# Patient Record
Sex: Male | Born: 1970 | Race: White | Hispanic: No | Smoking: Current every day smoker
Health system: Southern US, Community
[De-identification: ages and names within clinical notes are randomized; demographics above are authoritative.]

## PROBLEM LIST (undated history)

## (undated) DIAGNOSIS — E162 Hypoglycemia, unspecified: Secondary | ICD-10-CM

## (undated) DIAGNOSIS — M5126 Other intervertebral disc displacement, lumbar region: Secondary | ICD-10-CM

## (undated) DIAGNOSIS — M1990 Unspecified osteoarthritis, unspecified site: Secondary | ICD-10-CM

## (undated) DIAGNOSIS — M51369 Other intervertebral disc degeneration, lumbar region without mention of lumbar back pain or lower extremity pain: Secondary | ICD-10-CM

## (undated) DIAGNOSIS — F419 Anxiety disorder, unspecified: Secondary | ICD-10-CM

## (undated) DIAGNOSIS — K219 Gastro-esophageal reflux disease without esophagitis: Secondary | ICD-10-CM

## (undated) HISTORY — PX: CLAVICLE SURGERY: SHX598

## (undated) HISTORY — DX: Anxiety disorder, unspecified: F41.9

## (undated) HISTORY — PX: HAND SURGERY: SHX662

## (undated) HISTORY — DX: Other intervertebral disc degeneration, lumbar region without mention of lumbar back pain or lower extremity pain: M51.369

## (undated) HISTORY — DX: Other intervertebral disc displacement, lumbar region: M51.26

## (undated) HISTORY — DX: Unspecified osteoarthritis, unspecified site: M19.90

## (undated) HISTORY — DX: Gastro-esophageal reflux disease without esophagitis: K21.9

## (undated) HISTORY — DX: Hypoglycemia, unspecified: E16.2

---

## 2001-03-11 ENCOUNTER — Emergency Department (HOSPITAL_COMMUNITY): Admission: EM | Admit: 2001-03-11 | Discharge: 2001-03-12 | Payer: Self-pay | Admitting: *Deleted

## 2001-03-12 ENCOUNTER — Encounter: Payer: Self-pay | Admitting: *Deleted

## 2001-03-17 ENCOUNTER — Emergency Department (HOSPITAL_COMMUNITY): Admission: EM | Admit: 2001-03-17 | Discharge: 2001-03-17 | Payer: Self-pay | Admitting: *Deleted

## 2004-03-03 ENCOUNTER — Ambulatory Visit (HOSPITAL_COMMUNITY): Admission: RE | Admit: 2004-03-03 | Discharge: 2004-03-03 | Payer: Self-pay | Admitting: Family Medicine

## 2006-06-24 IMAGING — CR DG CLAVICLE*R*
3 series · 3 of 3 positions shown · non-contrast
Comparison: 03/12/01
 Resection of the distal right clavicle is again noted.
COMPARISON: 03/12/01
 Resection of the distal right clavicle is again noted.

CLINICAL DATA: Right shoulder pain injury. 
 THREE VIEW RIGHT CLAVICLE:

[view not recorded (1 of 3)]
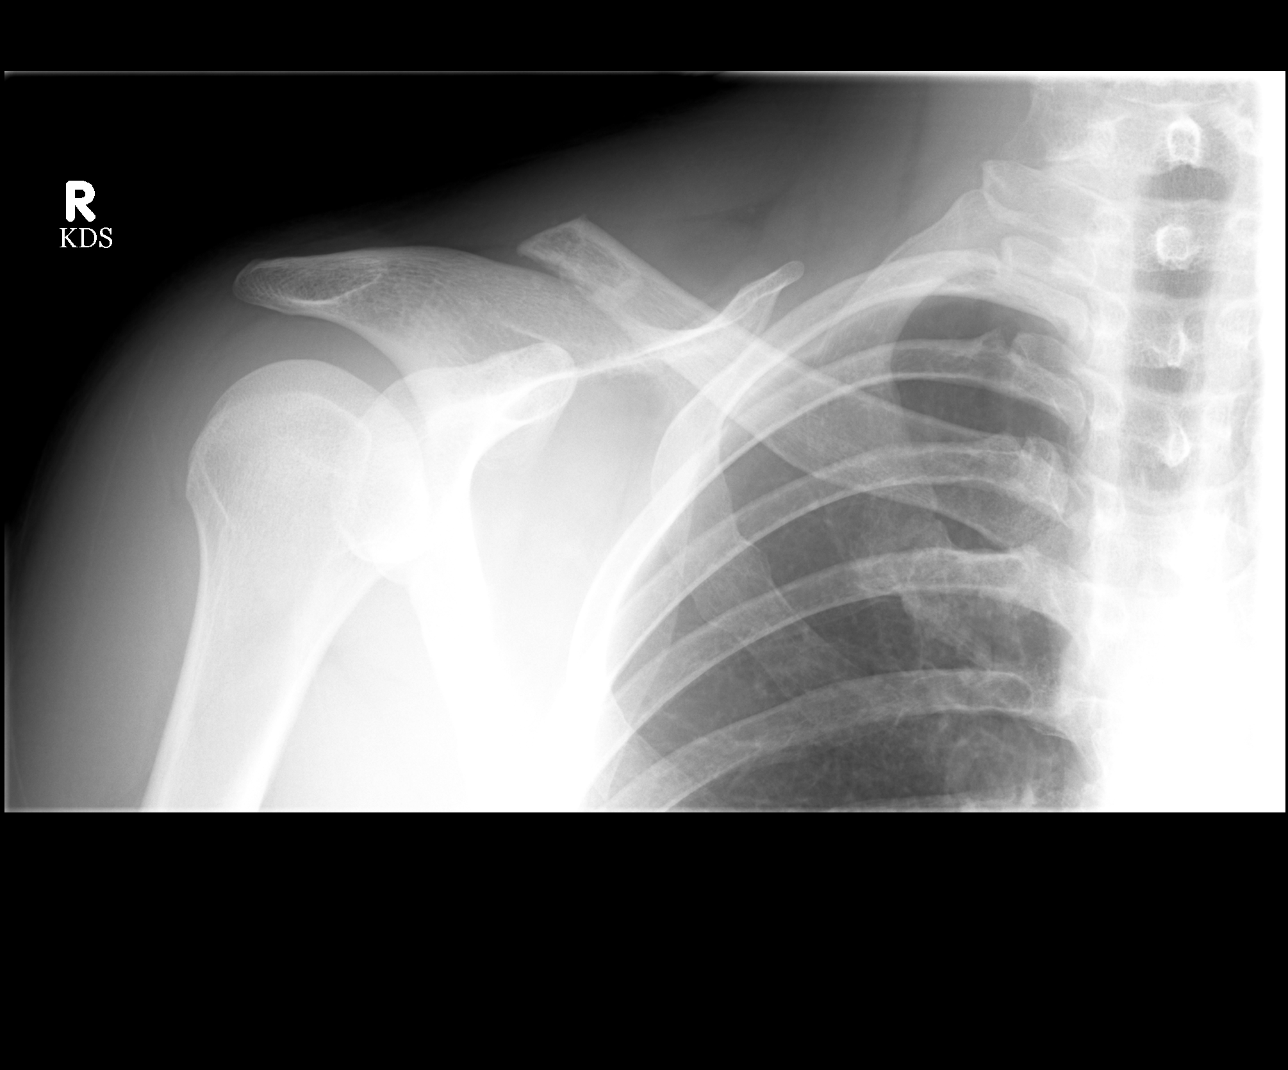

[view not recorded (2 of 3)]
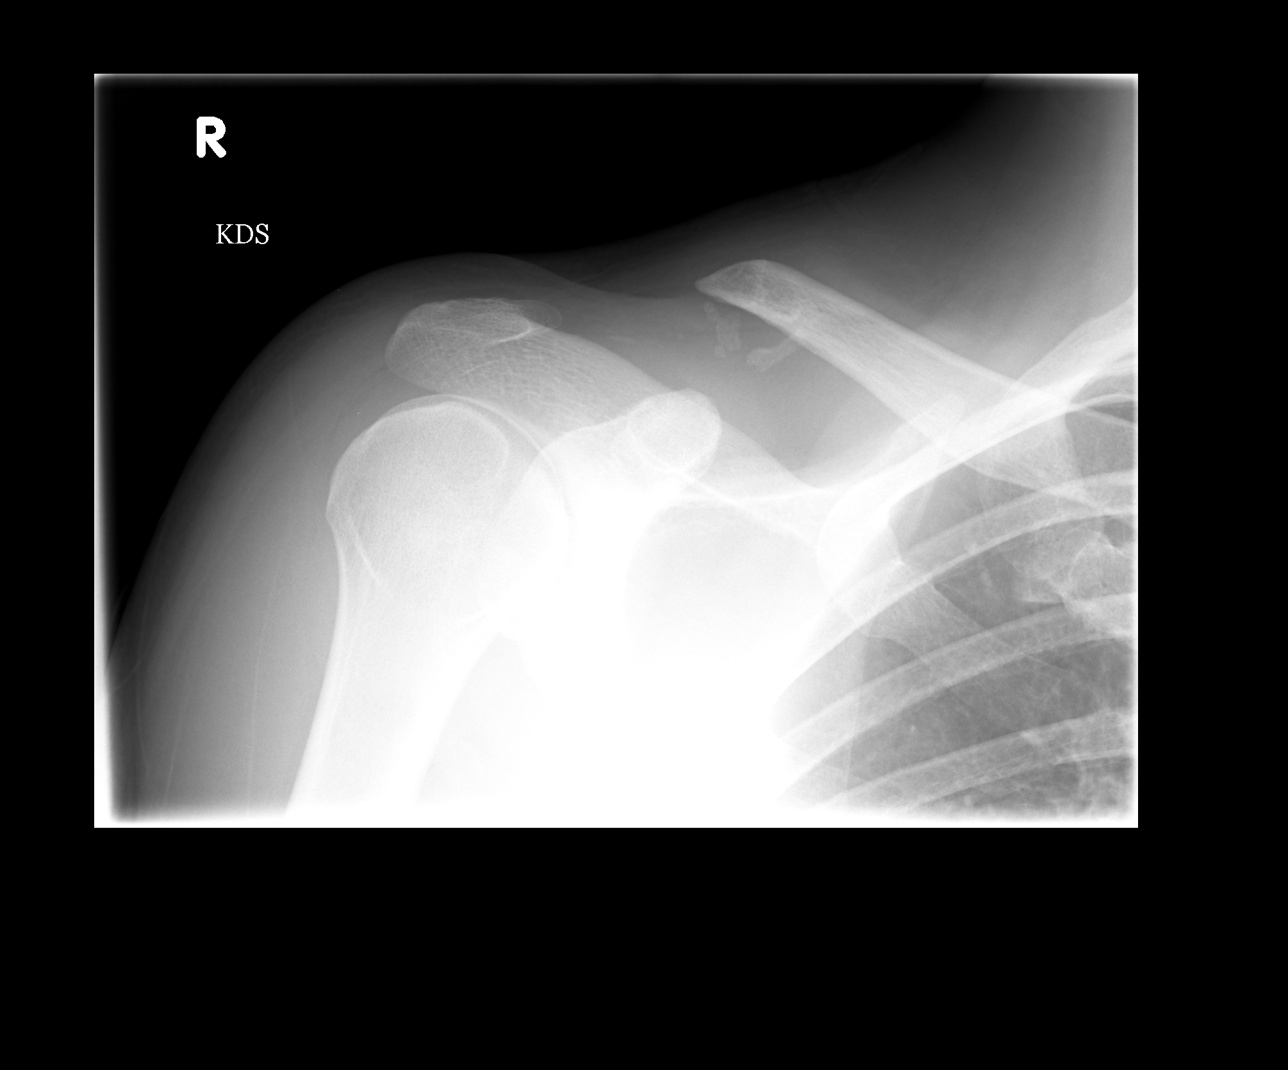

[view not recorded (3 of 3)]
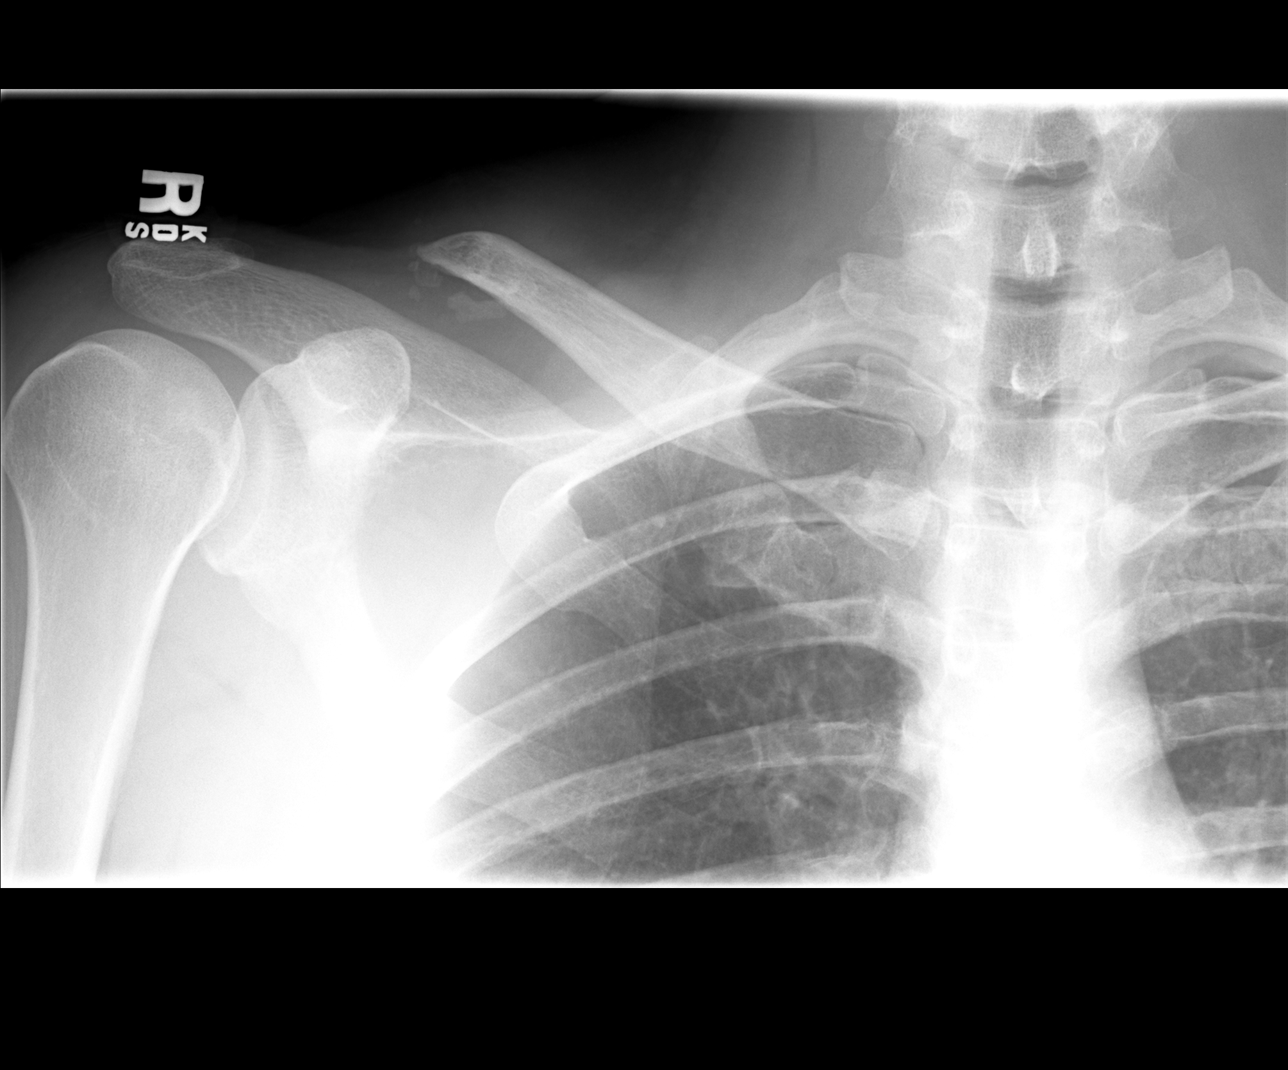

[3 of 3 positions shown; findings below may reference images not displayed]

There has been no interval change since the last study.  
 No acute abnormalities are identified.  There appears to a remote second rib fracture that is stable.
IMPRESSION: 1.  No acute abnormality.
 2.  Distal clavicular resection. 
 THREE VIEW RIGHT SHOULDER:
There has been no interval change since the last study.  
 No acute abnormalities are identified.  There appears to a remote second rib fracture that is stable.
IMPRESSION: 1.  No acute abnormality.
 2.  Distal clavicular resection.

## 2006-06-24 IMAGING — CR DG SHOULDER 2+V*R*
3 series · 3 of 3 positions shown · non-contrast
Comparison: 03/12/01
 Resection of the distal right clavicle is again noted.
COMPARISON: 03/12/01
 Resection of the distal right clavicle is again noted.

CLINICAL DATA: Right shoulder pain injury. 
 THREE VIEW RIGHT CLAVICLE:

[view not recorded (1 of 3)]
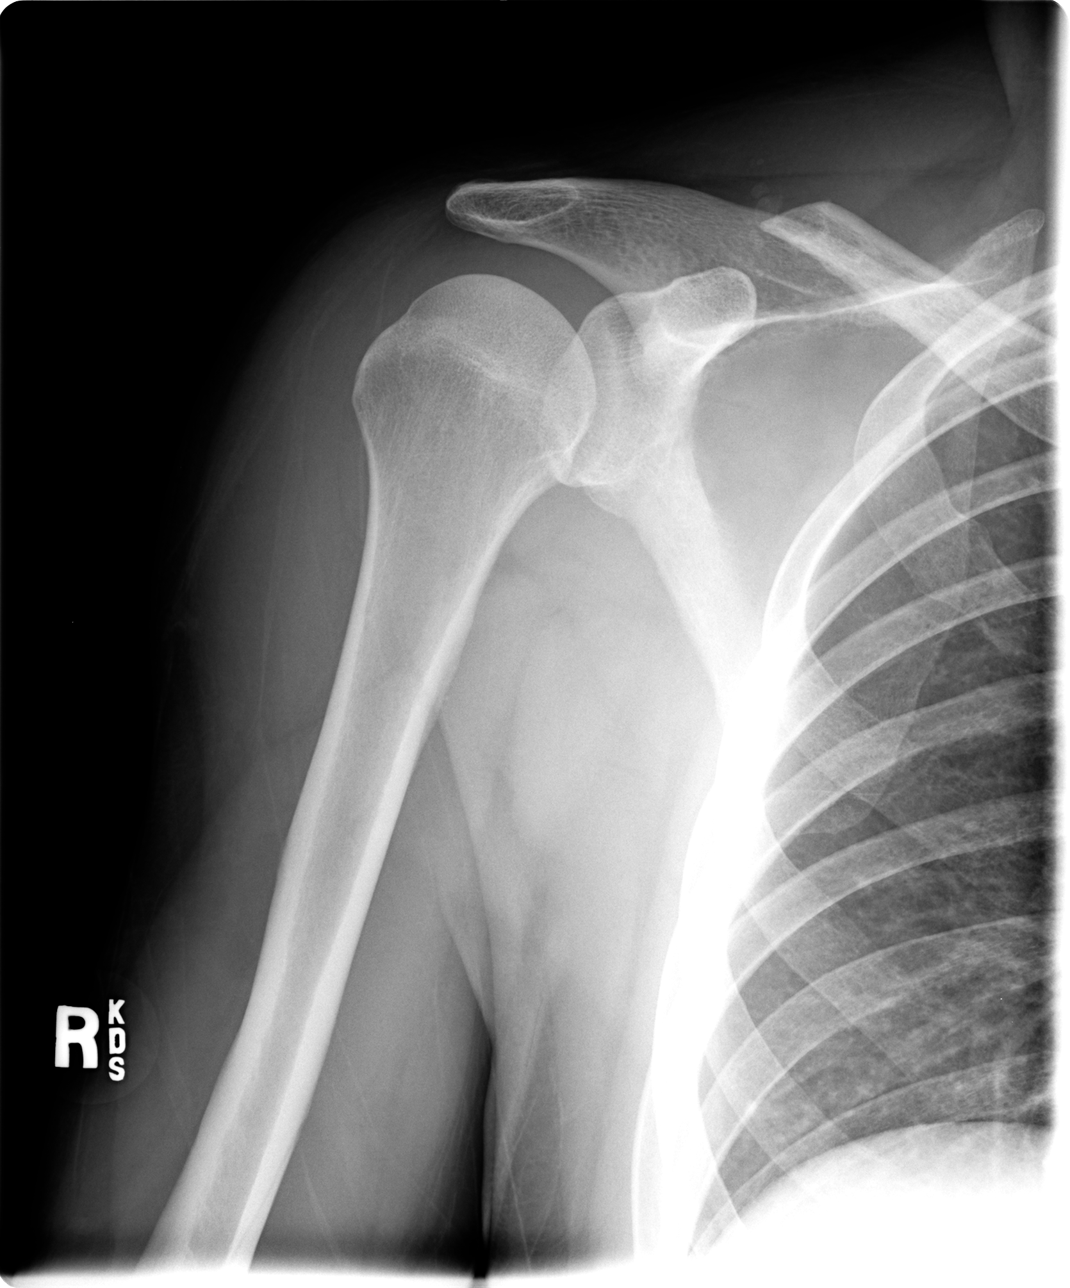

[view not recorded (2 of 3)]
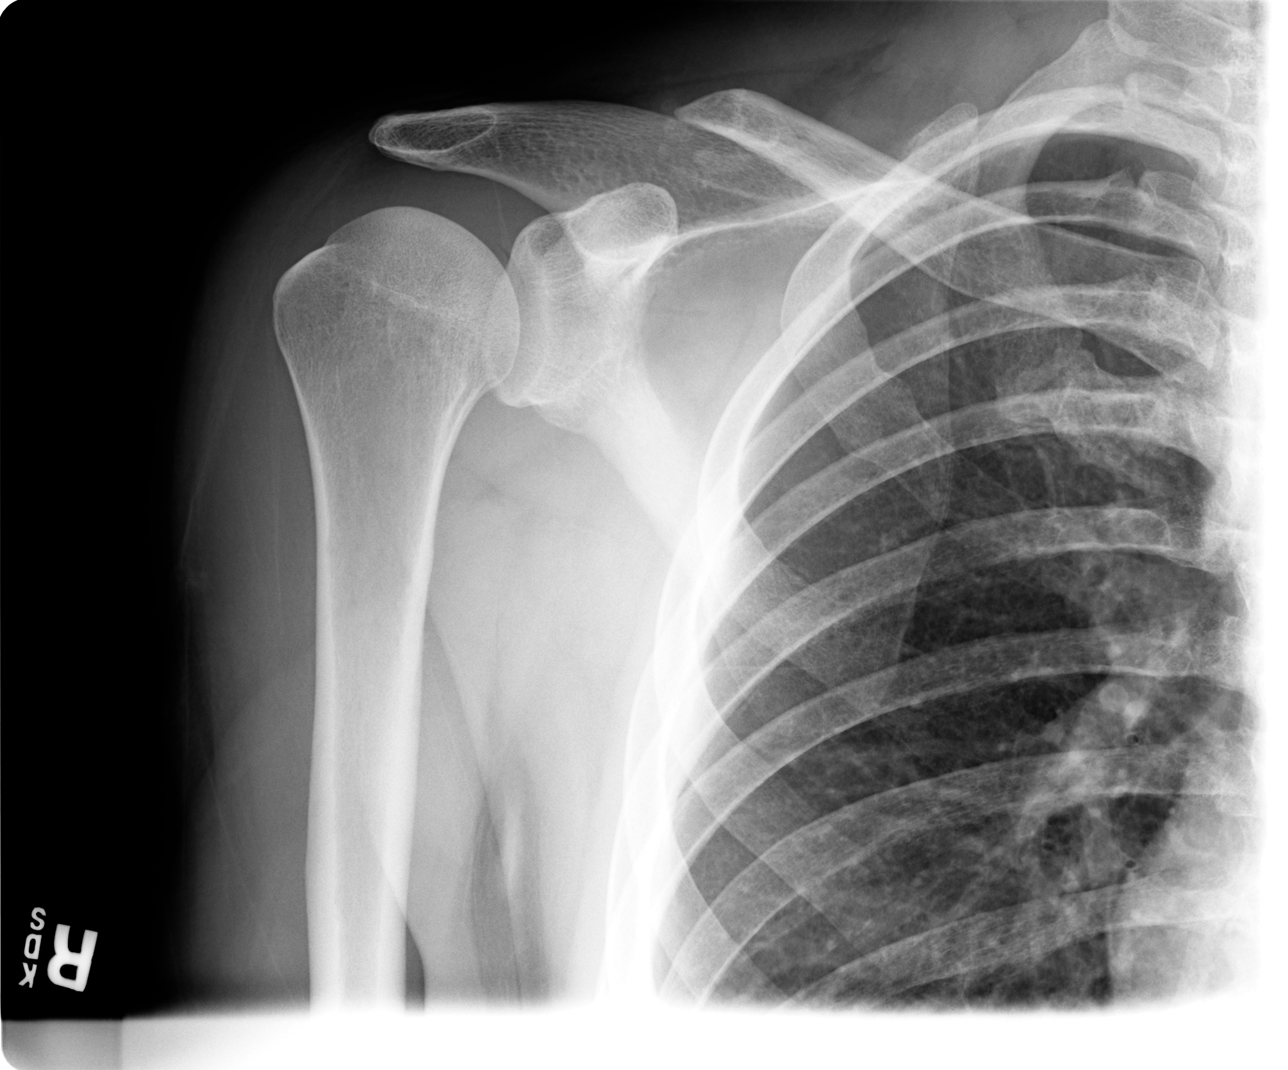

[view not recorded (3 of 3)]
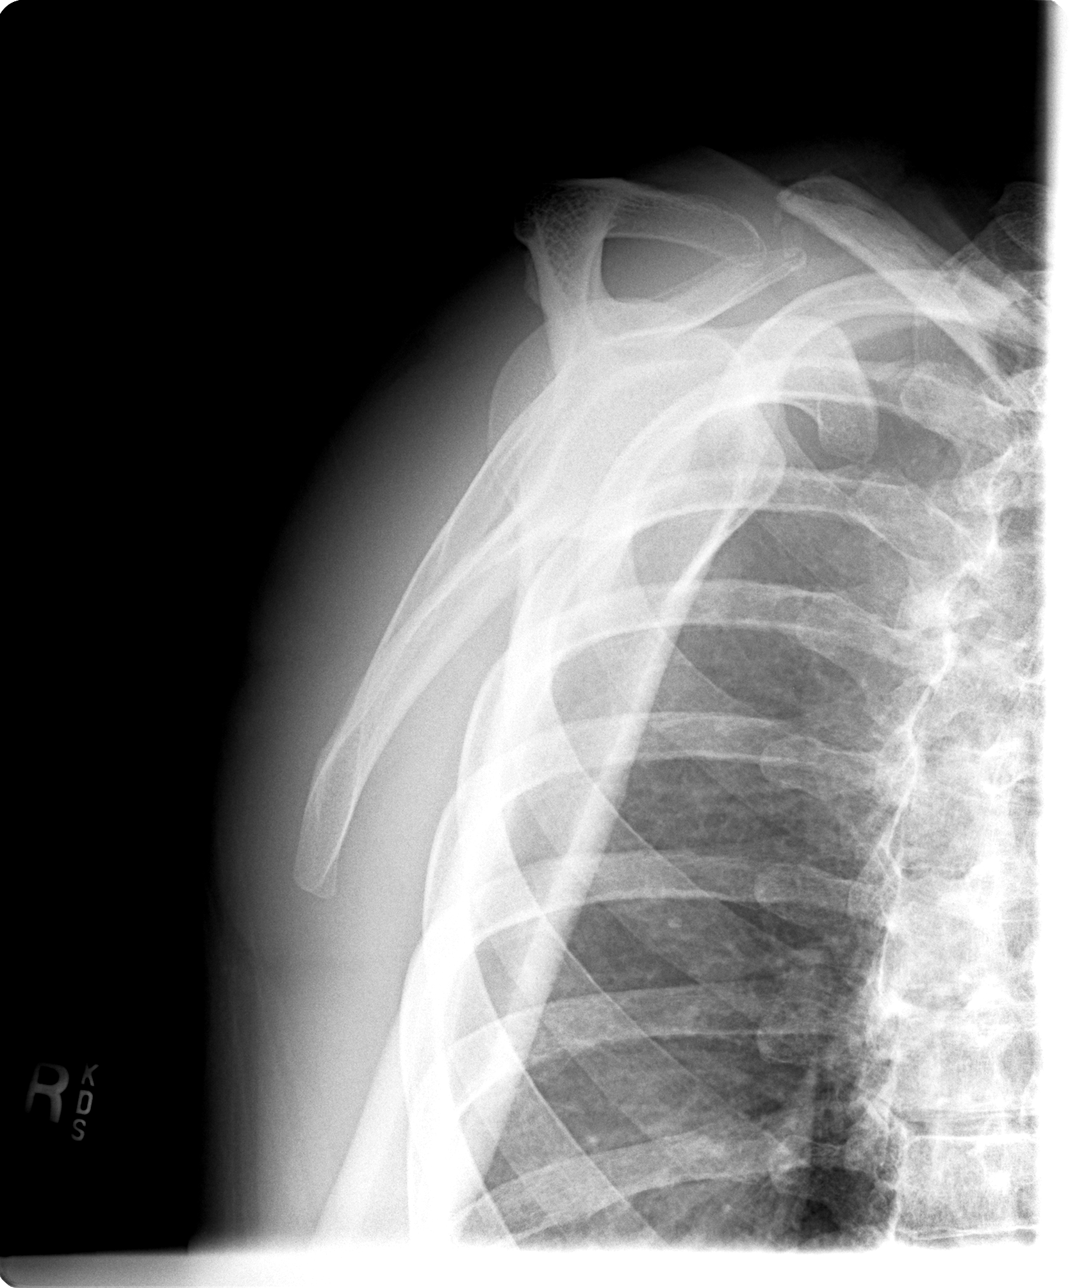

[3 of 3 positions shown; findings below may reference images not displayed]

There has been no interval change since the last study.  
 No acute abnormalities are identified.  There appears to a remote second rib fracture that is stable.
IMPRESSION: 1.  No acute abnormality.
 2.  Distal clavicular resection. 
 THREE VIEW RIGHT SHOULDER:
There has been no interval change since the last study.  
 No acute abnormalities are identified.  There appears to a remote second rib fracture that is stable.
IMPRESSION: 1.  No acute abnormality.
 2.  Distal clavicular resection.

## 2013-11-27 ENCOUNTER — Ambulatory Visit (INDEPENDENT_AMBULATORY_CARE_PROVIDER_SITE_OTHER): Payer: Medicare Other | Admitting: Psychiatry

## 2013-11-27 ENCOUNTER — Encounter (HOSPITAL_COMMUNITY): Payer: Self-pay | Admitting: Psychiatry

## 2013-11-27 VITALS — BP 135/71 | HR 81 | Ht 72.0 in | Wt 223.6 lb

## 2013-11-27 DIAGNOSIS — F411 Generalized anxiety disorder: Secondary | ICD-10-CM

## 2013-11-27 MED ORDER — MIRTAZAPINE 30 MG PO TABS: 30.0000 mg | ORAL_TABLET | Freq: Every day | ORAL | Status: DC

## 2013-11-27 MED ORDER — ALPRAZOLAM 1 MG PO TABS: 1.0000 mg | ORAL_TABLET | Freq: Four times a day (QID) | ORAL | Status: DC
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
## 2013-12-24 ENCOUNTER — Ambulatory Visit (HOSPITAL_COMMUNITY): Payer: Self-pay | Admitting: Psychiatry

## 2014-01-21 ENCOUNTER — Other Ambulatory Visit (HOSPITAL_COMMUNITY): Payer: Self-pay | Admitting: Psychiatry

## 2014-01-24 ENCOUNTER — Encounter (HOSPITAL_COMMUNITY): Payer: Self-pay | Admitting: Psychiatry

## 2014-01-24 ENCOUNTER — Ambulatory Visit (INDEPENDENT_AMBULATORY_CARE_PROVIDER_SITE_OTHER): Payer: Medicare Other | Admitting: Psychiatry

## 2014-01-24 ENCOUNTER — Telehealth (HOSPITAL_COMMUNITY): Payer: Self-pay | Admitting: *Deleted

## 2014-01-24 ENCOUNTER — Other Ambulatory Visit (HOSPITAL_COMMUNITY): Payer: Self-pay | Admitting: Psychiatry

## 2014-01-24 VITALS — BP 115/68 | HR 74 | Ht 72.0 in | Wt 224.0 lb

## 2014-01-24 DIAGNOSIS — F411 Generalized anxiety disorder: Secondary | ICD-10-CM

## 2014-01-24 MED ORDER — LISDEXAMFETAMINE DIMESYLATE 40 MG PO CAPS: 40.0000 mg | ORAL_CAPSULE | ORAL | Status: DC

## 2014-01-24 MED ORDER — ALPRAZOLAM 1 MG PO TABS: 1.0000 mg | ORAL_TABLET | Freq: Four times a day (QID) | ORAL | Status: DC

## 2014-01-24 MED ORDER — MIRTAZAPINE 30 MG PO TABS: 30.0000 mg | ORAL_TABLET | Freq: Every day | ORAL | Status: DC

## 2014-01-24 MED ORDER — METHYLPHENIDATE HCL 10 MG PO TABS: 10.0000 mg | ORAL_TABLET | Freq: Three times a day (TID) | ORAL | Status: DC
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
## 2014-01-24 NOTE — Telephone Encounter (Signed)
Pt calling stating he just took Vyvanse to your pharmacy and they told him that his insurance will not pay for it. Pt would like to know what to do. Pt number is [phone]

## 2014-01-24 NOTE — Telephone Encounter (Signed)
I have printed prescription for methylphenidate. He will need to pick it up

## 2014-01-24 NOTE — Telephone Encounter (Signed)
Pt came in to get his printed script. Pt agreed to script. Pt D/L number [phone]

## 2014-01-24 NOTE — Telephone Encounter (Signed)
Pt is aware his new printed script is ready for pick up. Pt showed understanding

## 2014-02-04 NOTE — Telephone Encounter (Signed)
Pt picked up his printed medication

## 2014-02-27 ENCOUNTER — Encounter (HOSPITAL_COMMUNITY): Payer: Self-pay | Admitting: Psychiatry

## 2014-02-27 ENCOUNTER — Ambulatory Visit (INDEPENDENT_AMBULATORY_CARE_PROVIDER_SITE_OTHER): Payer: Medicare Other | Admitting: Psychiatry

## 2014-02-27 VITALS — BP 138/75 | HR 68 | Ht 72.0 in | Wt 218.0 lb

## 2014-02-27 DIAGNOSIS — F411 Generalized anxiety disorder: Secondary | ICD-10-CM

## 2014-02-27 MED ORDER — MIRTAZAPINE 30 MG PO TABS: 30.0000 mg | ORAL_TABLET | Freq: Every day | ORAL | Status: DC

## 2014-02-27 MED ORDER — ALPRAZOLAM 1 MG PO TABS: 1.0000 mg | ORAL_TABLET | Freq: Four times a day (QID) | ORAL | Status: DC

## 2014-02-27 MED ORDER — METHYLPHENIDATE HCL 10 MG PO TABS: 10.0000 mg | ORAL_TABLET | Freq: Three times a day (TID) | ORAL | Status: DC
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
## 2014-04-29 ENCOUNTER — Encounter (HOSPITAL_COMMUNITY): Payer: Self-pay | Admitting: Psychiatry

## 2014-04-29 ENCOUNTER — Ambulatory Visit (INDEPENDENT_AMBULATORY_CARE_PROVIDER_SITE_OTHER): Payer: Medicare Other | Admitting: Psychiatry

## 2014-04-29 VITALS — BP 127/66 | HR 65 | Ht 72.0 in | Wt 217.0 lb

## 2014-04-29 DIAGNOSIS — F411 Generalized anxiety disorder: Secondary | ICD-10-CM

## 2014-04-29 MED ORDER — MIRTAZAPINE 30 MG PO TABS: 30.0000 mg | ORAL_TABLET | Freq: Every day | ORAL | Status: DC

## 2014-04-29 MED ORDER — ALPRAZOLAM 1 MG PO TABS: 1.0000 mg | ORAL_TABLET | Freq: Four times a day (QID) | ORAL | Status: DC

## 2014-04-29 MED ORDER — METHYLPHENIDATE HCL 10 MG PO TABS: 10.0000 mg | ORAL_TABLET | Freq: Three times a day (TID) | ORAL | Status: DC
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
## 2014-07-30 ENCOUNTER — Ambulatory Visit (INDEPENDENT_AMBULATORY_CARE_PROVIDER_SITE_OTHER): Payer: Medicare Other | Admitting: Psychiatry

## 2014-07-30 ENCOUNTER — Encounter (HOSPITAL_COMMUNITY): Payer: Self-pay | Admitting: Psychiatry

## 2014-07-30 VITALS — BP 132/81 | HR 66 | Ht 72.0 in | Wt 209.0 lb

## 2014-07-30 DIAGNOSIS — F411 Generalized anxiety disorder: Secondary | ICD-10-CM

## 2014-07-30 MED ORDER — METHYLPHENIDATE HCL 10 MG PO TABS: 10.0000 mg | ORAL_TABLET | Freq: Three times a day (TID) | ORAL | Status: DC

## 2014-07-30 MED ORDER — MIRTAZAPINE 30 MG PO TABS: 30.0000 mg | ORAL_TABLET | Freq: Every day | ORAL | Status: DC

## 2014-07-30 MED ORDER — ALPRAZOLAM 1 MG PO TABS: 1.0000 mg | ORAL_TABLET | Freq: Four times a day (QID) | ORAL | Status: DC

## 2014-07-30 MED ORDER — METHYLPHENIDATE HCL 10 MG PO TABS
10.0000 mg | ORAL_TABLET | Freq: Three times a day (TID) | ORAL | Status: DC
Start: 2014-07-30 — End: 2014-10-30
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
## 2014-08-18 ENCOUNTER — Other Ambulatory Visit (HOSPITAL_COMMUNITY): Payer: Self-pay | Admitting: Psychiatry

## 2014-10-30 ENCOUNTER — Encounter (HOSPITAL_COMMUNITY): Payer: Self-pay | Admitting: Psychiatry

## 2014-10-30 ENCOUNTER — Ambulatory Visit (INDEPENDENT_AMBULATORY_CARE_PROVIDER_SITE_OTHER): Payer: Medicare Other | Admitting: Psychiatry

## 2014-10-30 VITALS — BP 110/70 | HR 72 | Ht 72.0 in | Wt 210.2 lb

## 2014-10-30 DIAGNOSIS — F411 Generalized anxiety disorder: Secondary | ICD-10-CM | POA: Diagnosis not present

## 2014-10-30 MED ORDER — METHYLPHENIDATE HCL 10 MG PO TABS: 10.0000 mg | ORAL_TABLET | Freq: Three times a day (TID) | ORAL | Status: DC

## 2014-10-30 MED ORDER — MIRTAZAPINE 30 MG PO TABS: 30.0000 mg | ORAL_TABLET | Freq: Every day | ORAL | Status: DC

## 2014-10-30 MED ORDER — ALPRAZOLAM 1 MG PO TABS: 1.0000 mg | ORAL_TABLET | Freq: Four times a day (QID) | ORAL | Status: DC
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
## 2015-01-29 ENCOUNTER — Ambulatory Visit (INDEPENDENT_AMBULATORY_CARE_PROVIDER_SITE_OTHER): Payer: Medicare Other | Admitting: Psychiatry

## 2015-01-29 ENCOUNTER — Encounter (HOSPITAL_COMMUNITY): Payer: Self-pay | Admitting: Psychiatry

## 2015-01-29 VITALS — BP 119/64 | HR 67 | Ht 72.0 in | Wt 200.4 lb

## 2015-01-29 DIAGNOSIS — F411 Generalized anxiety disorder: Secondary | ICD-10-CM

## 2015-01-29 MED ORDER — METHYLPHENIDATE HCL 10 MG PO TABS: 10.0000 mg | ORAL_TABLET | Freq: Three times a day (TID) | ORAL | Status: DC

## 2015-01-29 MED ORDER — MIRTAZAPINE 30 MG PO TABS: 30.0000 mg | ORAL_TABLET | Freq: Every day | ORAL | Status: DC

## 2015-01-29 MED ORDER — ALPRAZOLAM 1 MG PO TABS: 1.0000 mg | ORAL_TABLET | Freq: Four times a day (QID) | ORAL | Status: DC
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
## 2015-03-26 ENCOUNTER — Other Ambulatory Visit (HOSPITAL_COMMUNITY): Payer: Self-pay | Admitting: Psychiatry

## 2015-03-31 ENCOUNTER — Telehealth (HOSPITAL_COMMUNITY): Payer: Self-pay | Admitting: *Deleted

## 2015-03-31 MED ORDER — METHYLPHENIDATE HCL 10 MG PO TABS: 10.0000 mg | ORAL_TABLET | Freq: Three times a day (TID) | ORAL | Status: DC

## 2015-03-31 NOTE — Telephone Encounter (Signed)
Pt called stating he is out of his Ritalin 10 mg. Per pt, he knows he have refills on it but the pharmacist told him that on the script it has do not fill until a specific date. Per pt, both December and January have 31 days and he asked the pharmacist to fill early but he won't refill until the date specified on script. Per pt, the pharmacist told him if Dr. Tenny Craw approve with them to fill it before date stated on script then they can fill pt medication early. Pt number is [phone]

## 2015-03-31 NOTE — Telephone Encounter (Signed)
Informed pt and called and spoke with Olegario Messier at pt pharmacy.

## 2015-03-31 NOTE — Telephone Encounter (Signed)
Per Dr. Tenny Craw to call pt pharmacy and approve them to go ahead and fill pt medication (Ritalin) today. Called pt pharmacy and spoke with Olegario Messier and she stated she will get it ready for pt.

## 2015-03-31 NOTE — Telephone Encounter (Signed)
They can fill it today

## 2015-04-29 ENCOUNTER — Encounter (HOSPITAL_COMMUNITY): Payer: Self-pay | Admitting: Psychiatry

## 2015-04-29 ENCOUNTER — Ambulatory Visit (INDEPENDENT_AMBULATORY_CARE_PROVIDER_SITE_OTHER): Payer: Medicare Other | Admitting: Psychiatry

## 2015-04-29 VITALS — BP 113/69 | HR 60 | Ht 72.0 in | Wt 192.4 lb

## 2015-04-29 DIAGNOSIS — F411 Generalized anxiety disorder: Secondary | ICD-10-CM | POA: Diagnosis not present

## 2015-04-29 MED ORDER — METHYLPHENIDATE HCL 10 MG PO TABS: 10.0000 mg | ORAL_TABLET | Freq: Three times a day (TID) | ORAL | Status: DC

## 2015-04-29 MED ORDER — ALPRAZOLAM 1 MG PO TABS: 1.0000 mg | ORAL_TABLET | Freq: Four times a day (QID) | ORAL | Status: DC

## 2015-04-29 MED ORDER — MIRTAZAPINE 30 MG PO TABS: 30.0000 mg | ORAL_TABLET | Freq: Every day | ORAL | Status: DC
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
## 2015-07-30 ENCOUNTER — Encounter (HOSPITAL_COMMUNITY): Payer: Self-pay | Admitting: Psychiatry

## 2015-07-30 ENCOUNTER — Ambulatory Visit (INDEPENDENT_AMBULATORY_CARE_PROVIDER_SITE_OTHER): Payer: Medicare Other | Admitting: Psychiatry

## 2015-07-30 VITALS — BP 125/78 | HR 93 | Ht 72.0 in | Wt 183.0 lb

## 2015-07-30 DIAGNOSIS — F411 Generalized anxiety disorder: Secondary | ICD-10-CM | POA: Diagnosis not present

## 2015-07-30 MED ORDER — METHYLPHENIDATE HCL 10 MG PO TABS: 10.0000 mg | ORAL_TABLET | Freq: Three times a day (TID) | ORAL | Status: DC

## 2015-07-30 MED ORDER — ALPRAZOLAM 1 MG PO TABS: 1.0000 mg | ORAL_TABLET | Freq: Four times a day (QID) | ORAL | Status: DC

## 2015-07-30 MED ORDER — MIRTAZAPINE 30 MG PO TABS: 30.0000 mg | ORAL_TABLET | Freq: Every day | ORAL | Status: DC
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
## 2015-10-28 ENCOUNTER — Telehealth (HOSPITAL_COMMUNITY): Payer: Self-pay | Admitting: *Deleted

## 2015-10-28 NOTE — Telephone Encounter (Signed)
called pt to resch appt for Sept 7th appt due to provider being out of office around appt time. unable to reach pt due to number not working. message said call could not go through. no other number on file.

## 2015-10-29 ENCOUNTER — Ambulatory Visit (INDEPENDENT_AMBULATORY_CARE_PROVIDER_SITE_OTHER): Payer: Medicare Other | Admitting: Psychiatry

## 2015-10-29 ENCOUNTER — Encounter (HOSPITAL_COMMUNITY): Payer: Self-pay | Admitting: Psychiatry

## 2015-10-29 VITALS — BP 140/83 | HR 89 | Ht 73.0 in | Wt 181.6 lb

## 2015-10-29 DIAGNOSIS — F411 Generalized anxiety disorder: Secondary | ICD-10-CM | POA: Diagnosis not present

## 2015-10-29 MED ORDER — ALPRAZOLAM 1 MG PO TABS: 1.0000 mg | ORAL_TABLET | Freq: Four times a day (QID) | ORAL | 2 refills | Status: DC

## 2015-10-29 MED ORDER — MIRTAZAPINE 30 MG PO TABS: 30.0000 mg | ORAL_TABLET | Freq: Every day | ORAL | 2 refills | Status: DC

## 2015-10-29 MED ORDER — METHYLPHENIDATE HCL 20 MG PO TABS: 20.0000 mg | ORAL_TABLET | Freq: Three times a day (TID) | ORAL | 0 refills | Status: DC
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
## 2015-11-26 ENCOUNTER — Encounter (HOSPITAL_COMMUNITY): Payer: Self-pay | Admitting: Psychiatry

## 2015-11-26 ENCOUNTER — Ambulatory Visit (INDEPENDENT_AMBULATORY_CARE_PROVIDER_SITE_OTHER): Payer: Medicare Other | Admitting: Psychiatry

## 2015-11-26 VITALS — BP 95/74 | HR 105 | Ht 73.0 in | Wt 172.0 lb

## 2015-11-26 DIAGNOSIS — F411 Generalized anxiety disorder: Secondary | ICD-10-CM | POA: Diagnosis not present

## 2015-11-26 MED ORDER — METHYLPHENIDATE HCL 20 MG PO TABS: 20.0000 mg | ORAL_TABLET | Freq: Three times a day (TID) | ORAL | 0 refills | Status: DC

## 2015-11-26 MED ORDER — TEMAZEPAM 15 MG PO CAPS: 15.0000 mg | ORAL_CAPSULE | Freq: Every evening | ORAL | 2 refills | Status: DC | PRN
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
## 2015-11-27 ENCOUNTER — Telehealth (HOSPITAL_COMMUNITY): Payer: Self-pay | Admitting: *Deleted

## 2015-11-27 NOTE — Telephone Encounter (Signed)
Called pt and informed him that his pharmacy need to send office PA form for the medications his insurance is not wanting to pay for. Pt verbalized understanding.

## 2015-11-27 NOTE — Telephone Encounter (Signed)
VOICE MESSAGE FROM PATIENT, HIS INSURANCE WILL NOT COVER MEDS PRESCRIBED.

## 2015-12-24 ENCOUNTER — Telehealth (HOSPITAL_COMMUNITY): Payer: Self-pay | Admitting: *Deleted

## 2015-12-24 NOTE — Telephone Encounter (Signed)
Pt called stating that his Ritalin will run out on Sunday 12-27-2015. Per pt there is 31 days in October and would like to have office call his pharmacy and give them the go ahead to refill 1-2 day early. Per pt the pharmacy is not open on Sunday. Pt number is [phone].

## 2015-12-24 NOTE — Telephone Encounter (Signed)
He can get it on 11/3

## 2015-12-28 NOTE — Telephone Encounter (Signed)
Called pt pharmacy and spoke with Mellody DanceKeith and he stated pt already picked up medication.

## 2016-01-25 ENCOUNTER — Other Ambulatory Visit (HOSPITAL_COMMUNITY): Payer: Self-pay | Admitting: Psychiatry

## 2016-01-26 ENCOUNTER — Encounter (HOSPITAL_COMMUNITY): Payer: Self-pay | Admitting: Psychiatry

## 2016-01-26 ENCOUNTER — Ambulatory Visit (INDEPENDENT_AMBULATORY_CARE_PROVIDER_SITE_OTHER): Payer: Medicare Other | Admitting: Psychiatry

## 2016-01-26 VITALS — BP 116/63 | HR 77 | Ht 73.0 in | Wt 176.4 lb

## 2016-01-26 DIAGNOSIS — F411 Generalized anxiety disorder: Secondary | ICD-10-CM | POA: Diagnosis not present

## 2016-01-26 DIAGNOSIS — Z811 Family history of alcohol abuse and dependence: Secondary | ICD-10-CM

## 2016-01-26 DIAGNOSIS — Z79891 Long term (current) use of opiate analgesic: Secondary | ICD-10-CM

## 2016-01-26 DIAGNOSIS — Z888 Allergy status to other drugs, medicaments and biological substances status: Secondary | ICD-10-CM

## 2016-01-26 DIAGNOSIS — Z818 Family history of other mental and behavioral disorders: Secondary | ICD-10-CM

## 2016-01-26 DIAGNOSIS — Z79899 Other long term (current) drug therapy: Secondary | ICD-10-CM | POA: Diagnosis not present

## 2016-01-26 MED ORDER — METHYLPHENIDATE HCL 20 MG PO TABS: 20.0000 mg | ORAL_TABLET | Freq: Three times a day (TID) | ORAL | 0 refills | Status: DC

## 2016-01-26 MED ORDER — ALPRAZOLAM 1 MG PO TABS: 1.0000 mg | ORAL_TABLET | Freq: Four times a day (QID) | ORAL | 2 refills | Status: DC
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
## 2016-03-28 ENCOUNTER — Encounter (HOSPITAL_COMMUNITY): Payer: Self-pay | Admitting: Psychiatry

## 2016-03-28 ENCOUNTER — Ambulatory Visit (INDEPENDENT_AMBULATORY_CARE_PROVIDER_SITE_OTHER): Payer: Medicare Other | Admitting: Psychiatry

## 2016-03-28 VITALS — BP 132/74 | HR 100 | Ht 69.0 in | Wt 170.0 lb

## 2016-03-28 DIAGNOSIS — Z888 Allergy status to other drugs, medicaments and biological substances status: Secondary | ICD-10-CM

## 2016-03-28 DIAGNOSIS — Z811 Family history of alcohol abuse and dependence: Secondary | ICD-10-CM | POA: Diagnosis not present

## 2016-03-28 DIAGNOSIS — F411 Generalized anxiety disorder: Secondary | ICD-10-CM | POA: Diagnosis not present

## 2016-03-28 DIAGNOSIS — Z79899 Other long term (current) drug therapy: Secondary | ICD-10-CM

## 2016-03-28 DIAGNOSIS — Z79891 Long term (current) use of opiate analgesic: Secondary | ICD-10-CM | POA: Diagnosis not present

## 2016-03-28 DIAGNOSIS — Z818 Family history of other mental and behavioral disorders: Secondary | ICD-10-CM

## 2016-03-28 MED ORDER — METHYLPHENIDATE HCL 20 MG PO TABS: 20.0000 mg | ORAL_TABLET | Freq: Three times a day (TID) | ORAL | 0 refills | Status: DC

## 2016-03-28 MED ORDER — ALPRAZOLAM 1 MG PO TABS: 1.0000 mg | ORAL_TABLET | Freq: Four times a day (QID) | ORAL | 2 refills | Status: DC
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
## 2016-06-24 ENCOUNTER — Encounter (HOSPITAL_COMMUNITY): Payer: Self-pay | Admitting: Psychiatry

## 2016-06-24 ENCOUNTER — Ambulatory Visit (INDEPENDENT_AMBULATORY_CARE_PROVIDER_SITE_OTHER): Payer: Medicare Other | Admitting: Psychiatry

## 2016-06-24 VITALS — BP 133/70 | HR 92 | Ht 69.0 in | Wt 172.0 lb

## 2016-06-24 DIAGNOSIS — F411 Generalized anxiety disorder: Secondary | ICD-10-CM | POA: Diagnosis not present

## 2016-06-24 DIAGNOSIS — Z79899 Other long term (current) drug therapy: Secondary | ICD-10-CM | POA: Diagnosis not present

## 2016-06-24 DIAGNOSIS — Z811 Family history of alcohol abuse and dependence: Secondary | ICD-10-CM | POA: Diagnosis not present

## 2016-06-24 DIAGNOSIS — Z818 Family history of other mental and behavioral disorders: Secondary | ICD-10-CM

## 2016-06-24 MED ORDER — ALPRAZOLAM 1 MG PO TABS: 1.0000 mg | ORAL_TABLET | Freq: Four times a day (QID) | ORAL | 2 refills | Status: DC

## 2016-06-24 MED ORDER — METHYLPHENIDATE HCL 20 MG PO TABS: 20.0000 mg | ORAL_TABLET | Freq: Three times a day (TID) | ORAL | 0 refills | Status: DC

## 2016-06-24 MED ORDER — TEMAZEPAM 15 MG PO CAPS: 15.0000 mg | ORAL_CAPSULE | Freq: Every day | ORAL | 2 refills | Status: DC
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
## 2016-08-18 ENCOUNTER — Other Ambulatory Visit (HOSPITAL_COMMUNITY): Payer: Self-pay | Admitting: Psychiatry

## 2016-08-24 DIAGNOSIS — R51 Headache: Secondary | ICD-10-CM | POA: Diagnosis not present

## 2016-08-24 DIAGNOSIS — Z79899 Other long term (current) drug therapy: Secondary | ICD-10-CM | POA: Diagnosis not present

## 2016-08-24 DIAGNOSIS — K122 Cellulitis and abscess of mouth: Secondary | ICD-10-CM | POA: Diagnosis not present

## 2016-08-24 DIAGNOSIS — F17200 Nicotine dependence, unspecified, uncomplicated: Secondary | ICD-10-CM | POA: Diagnosis not present

## 2016-09-22 ENCOUNTER — Encounter (HOSPITAL_COMMUNITY): Payer: Self-pay | Admitting: Psychiatry

## 2016-09-22 ENCOUNTER — Ambulatory Visit (INDEPENDENT_AMBULATORY_CARE_PROVIDER_SITE_OTHER): Payer: Medicare Other | Admitting: Psychiatry

## 2016-09-22 VITALS — BP 132/74 | HR 73 | Wt 169.0 lb

## 2016-09-22 DIAGNOSIS — Z818 Family history of other mental and behavioral disorders: Secondary | ICD-10-CM | POA: Diagnosis not present

## 2016-09-22 DIAGNOSIS — Z811 Family history of alcohol abuse and dependence: Secondary | ICD-10-CM

## 2016-09-22 DIAGNOSIS — F411 Generalized anxiety disorder: Secondary | ICD-10-CM | POA: Diagnosis not present

## 2016-09-22 MED ORDER — METHYLPHENIDATE HCL 20 MG PO TABS: 20.0000 mg | ORAL_TABLET | Freq: Three times a day (TID) | ORAL | 0 refills | Status: DC

## 2016-09-22 MED ORDER — ALPRAZOLAM 1 MG PO TABS: 1.0000 mg | ORAL_TABLET | Freq: Four times a day (QID) | ORAL | 2 refills | Status: DC

## 2016-09-22 MED ORDER — HYDROXYZINE PAMOATE 50 MG PO CAPS: 50.0000 mg | ORAL_CAPSULE | Freq: Every day | ORAL | 2 refills | Status: DC
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
## 2016-12-12 ENCOUNTER — Other Ambulatory Visit (HOSPITAL_COMMUNITY): Payer: Self-pay | Admitting: Psychiatry

## 2016-12-20 ENCOUNTER — Encounter (HOSPITAL_COMMUNITY): Payer: Self-pay | Admitting: Psychiatry

## 2016-12-20 ENCOUNTER — Ambulatory Visit (INDEPENDENT_AMBULATORY_CARE_PROVIDER_SITE_OTHER): Payer: Medicare Other | Admitting: Psychiatry

## 2016-12-20 VITALS — BP 135/78 | HR 66 | Ht 69.0 in | Wt 166.2 lb

## 2016-12-20 DIAGNOSIS — Z811 Family history of alcohol abuse and dependence: Secondary | ICD-10-CM

## 2016-12-20 DIAGNOSIS — F411 Generalized anxiety disorder: Secondary | ICD-10-CM

## 2016-12-20 DIAGNOSIS — G4700 Insomnia, unspecified: Secondary | ICD-10-CM | POA: Diagnosis not present

## 2016-12-20 DIAGNOSIS — Z818 Family history of other mental and behavioral disorders: Secondary | ICD-10-CM

## 2016-12-20 DIAGNOSIS — Z736 Limitation of activities due to disability: Secondary | ICD-10-CM

## 2016-12-20 DIAGNOSIS — Z634 Disappearance and death of family member: Secondary | ICD-10-CM

## 2016-12-20 DIAGNOSIS — F909 Attention-deficit hyperactivity disorder, unspecified type: Secondary | ICD-10-CM | POA: Diagnosis not present

## 2016-12-20 DIAGNOSIS — F17210 Nicotine dependence, cigarettes, uncomplicated: Secondary | ICD-10-CM | POA: Diagnosis not present

## 2016-12-20 MED ORDER — ALPRAZOLAM 1 MG PO TABS: 1.0000 mg | ORAL_TABLET | Freq: Four times a day (QID) | ORAL | 2 refills | Status: DC

## 2016-12-20 MED ORDER — METHYLPHENIDATE HCL 20 MG PO TABS: 20.0000 mg | ORAL_TABLET | Freq: Three times a day (TID) | ORAL | 0 refills | Status: DC

## 2016-12-20 MED ORDER — MIRTAZAPINE 15 MG PO TABS: 15.0000 mg | ORAL_TABLET | Freq: Every day | ORAL | 2 refills | Status: DC

## 2016-12-20 NOTE — Progress Notes (Signed)
BH MD/PA/NP OP Progress Note  12/20/2016 11:23 AM Manuel Kelly  MRN:  [id number]  Chief Complaint:  Chief Complaint    Depression; Anxiety; ADHD; Follow-up     HPI:  this patient is a 46 year old divorced white male who lives alone in [place]. He has a 23 year old son. He is on disability  The patient was referred by [facility] family medicine for further evaluation of anxiety.  The patient states that he began having issues with depression and anxiety as a younger child. At age 69 he got very depressed after his grandmother died. He was seen at the [facility]. At age 15 he was hospitalized at [facility] after he attempted suicide over breakup with a girlfriend. He continued followup on and off at the mental Health Center. Unfortunately he got heavily into drinking and using drugs primarily cocaine. He was arrested for cocaine possession. He was later charged with possession of a firearm while being a convicted felon and driving without a license and spends some time in the state prison. In the mid 90s he had to go to a rehabilitation center to get off alcohol. He claims that he quit using drugs about 8 years ago.  The patient has had several other times in jail most recently over the summer. These are all related to driving without a license.  Over the years the patient has had several psychiatric diagnoses. He saw Dr. Karn Cassis at [facility] who diagnosed him with ADHD, possible bipolar disorder and generalized anxiety disorder. He admits he was a hyperactive child and couldn't focus and ended up quitting in the ninth grade after repeating the seventh grade twice. He's never been on medication for ADHD. He claims he's had the best response to combination of Xanax and Remeron.  The patient states that over the summer his medications were stopped while he was in jail. Since then he's been extremely anxious and shakes all the time.  His stomach is in knots and he has constant diarrhea. He is unable to sleep. He's been crying a lot and very frustrated. 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 He's had recurrent panic attacks and severe social anxiety. He admits to passive suicidal ideation but with no specific plan and no homicidal ideation. He denies any psychotic symptoms. He has few activities that spends all this time with family and his girlfriend. He also admits to chronic pain in his back from degenerative disc disease and past accident  Patient returns after 3 months.  He states that he has been doing better.  His 66-year-old granddaughter stays with him most of the time and keeps him company.  He also has a new little dog that is cheered him up.  He states that his mood is been good but he still has difficulty sleeping.  We have tried numerous medications most lately hydroxyzine which has not helped.  He states Remeron helped the most but made him too groggy at the 30 mg dose.  I suggested we go down to the 50 mg dosage.  He takes his last methylphenidate at 3 PM so he does not think it is from this and he does not drink excessive amounts of caffeine.  He is staying well focused and his anxiety is under good control with his current medications Visit Diagnosis:    ICD-10-CM   1. Generalized anxiety disorder F41.1     Past Psychiatric History: [facility]  Past Medical History:  Past Medical History:  Diagnosis Date  . Anxiety   . Bulging lumbar disc   . Degenerative arthritis   . GERD (gastroesophageal reflux disease)   . Hypoglycemia     Past Surgical History:  Procedure Laterality Date  . CLAVICLE SURGERY    . HAND SURGERY      Family Psychiatric History: Below  Family History:  Family History  Problem Relation Age of Onset  . Anxiety disorder Mother   . Alcohol abuse Father   . Anxiety disorder Father   . Depression Father   . ADD / ADHD Son      Social History:  Social History   Social History  . Marital status: Divorced    Spouse name: N/A  . Number of children: N/A  . Years of education: N/A   Social History Main Topics  . Smoking status: Current Every Day Smoker    Packs/day: 1.00    Years: 30.00  . Smokeless tobacco: Never Used  . Alcohol use No  . Drug use: No     Comment: per pt he stopped Cocaine 5-6 years ago and did use Marijuana Christmas of 2014  . Sexual activity: Yes    Partners: Female    Birth control/ protection: Condom   Other Topics Concern  . None   Social History Narrative  . None    Allergies:  Allergies  Allergen Reactions  . Other     IV Dye, Throat swells up and rash  . Talwin [Pentazocine]     Tongue swell up    Metabolic Disorder Labs: No results found for: HGBA1C, MPG No results found for: PROLACTIN No results found for: CHOL, TRIG, HDL, CHOLHDL, VLDL, LDLCALC No results found for: TSH  Therapeutic Level Labs: No results found for: LITHIUM No results found for: VALPROATE No components found for:  CBMZ  Current Medications: Current Outpatient Prescriptions  Medication Sig Dispense Refill  . ALPRAZolam (XANAX) 1 MG tablet Take 1 tablet (1 mg total) by mouth 4 (four) times daily. 120 tablet 2  . fluticasone (FLONASE) 50 MCG/ACT nasal spray Place 2 sprays into both nostrils 4 (four) times daily.    Marland Kitchen. lovastatin (MEVACOR) 40 MG tablet Take 40 mg by mouth daily.    . methylphenidate (RITALIN) 20 MG tablet Take 1 tablet (20 mg total) by mouth 3 (three) times daily with meals. 90 tablet 0  . NON FORMULARY ?Cotton Liver Capsules? (Mult.) BID    . omeprazole (PRILOSEC) 40 MG capsule Take 40 mg by mouth daily.     Marland Kitchen. PROAIR HFA 108 (90 BASE) MCG/ACT inhaler Inhale into the lungs as needed. Reported on 04/29/2015    . methylphenidate (RITALIN) 20 MG tablet Take 1 tablet (20 mg total) by mouth 3 (three) times daily with meals. 90 tablet 0  . methylphenidate (RITALIN) 20 MG tablet  Take 1 tablet (20 mg total) by mouth 3 (three) times daily. 90 tablet 0  . mirtazapine (REMERON) 15 MG tablet Take 1 tablet (15 mg total) by mouth at bedtime. 30 tablet 2   No current facility-administered medications for this visit.      Musculoskeletal: Strength & Muscle Tone: within normal limits Gait & Station: normal Patient leans: N/A  Psychiatric Specialty Exam: Review of Systems  Psychiatric/Behavioral: The patient has insomnia.   All other systems reviewed and are negative.   Blood pressure 135/78, pulse 66, height 5\' 9"  (1.753 m), weight 166 lb 3.2  oz (75.4 kg).Body mass index is 24.54 kg/m.  General Appearance: Casual and Disheveled  Eye Contact:  Good  Speech:  Clear and Coherent  Volume:  Normal  Mood:  Euthymic  Affect:  Appropriate  Thought Process:  Goal Directed  Orientation:  Full (Time, Place, and Person)  Thought Content: WDL   Suicidal Thoughts:  No  Homicidal Thoughts:  No  Memory:  Immediate;   Good Recent;   Good Remote;   Fair  Judgement:  Fair  Insight:  Fair  Psychomotor Activity:  Normal  Concentration:  Concentration: Good and Attention Span: Good with medication  Recall:  Good  Fund of Knowledge: Good  Language: Good  Akathisia:  No  Handed:  Right  AIMS (if indicated): not done  Assets:  Communication Skills Desire for Improvement Physical Health Resilience Social Support Talents/Skills  ADL's:  Intact  Cognition: WNL  Sleep:  Poor   Screenings:   Assessment and Plan: Patient is a 46 year old white male with a history of anxiety insomnia and ADHD.  His anxiety and insomnia are improved on his current medication so the Xanax 1 mg 4 times daily and methylphenidate 20 mg 3 times daily will be continued.  He will restart mirtazapine at 50 mg at bedtime to help with insomnia.  He will return to see me in 3 months   [name], MD 12/20/2016, 11:23 AM

## 2017-01-23 ENCOUNTER — Encounter (HOSPITAL_COMMUNITY): Payer: Self-pay | Admitting: Psychiatry

## 2017-01-23 ENCOUNTER — Telehealth (HOSPITAL_COMMUNITY): Payer: Self-pay | Admitting: *Deleted

## 2017-01-23 ENCOUNTER — Ambulatory Visit (INDEPENDENT_AMBULATORY_CARE_PROVIDER_SITE_OTHER): Payer: Medicare Other | Admitting: Psychiatry

## 2017-01-23 VITALS — BP 136/78 | Ht 69.0 in | Wt 166.0 lb

## 2017-01-23 DIAGNOSIS — F411 Generalized anxiety disorder: Secondary | ICD-10-CM

## 2017-01-23 DIAGNOSIS — R450 Nervousness: Secondary | ICD-10-CM

## 2017-01-23 DIAGNOSIS — Z818 Family history of other mental and behavioral disorders: Secondary | ICD-10-CM

## 2017-01-23 DIAGNOSIS — G4700 Insomnia, unspecified: Secondary | ICD-10-CM

## 2017-01-23 DIAGNOSIS — F901 Attention-deficit hyperactivity disorder, predominantly hyperactive type: Secondary | ICD-10-CM | POA: Diagnosis not present

## 2017-01-23 DIAGNOSIS — Z736 Limitation of activities due to disability: Secondary | ICD-10-CM

## 2017-01-23 DIAGNOSIS — Z811 Family history of alcohol abuse and dependence: Secondary | ICD-10-CM | POA: Diagnosis not present

## 2017-01-23 DIAGNOSIS — F17210 Nicotine dependence, cigarettes, uncomplicated: Secondary | ICD-10-CM

## 2017-01-23 MED ORDER — ALPRAZOLAM 1 MG PO TABS: 1.0000 mg | ORAL_TABLET | Freq: Four times a day (QID) | ORAL | 2 refills | Status: DC
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
## 2017-01-23 NOTE — Telephone Encounter (Signed)
Let him know that I cannot refill anything without a police report
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
## 2017-03-22 ENCOUNTER — Ambulatory Visit (HOSPITAL_COMMUNITY): Payer: Self-pay | Admitting: Psychiatry

## 2017-03-22 ENCOUNTER — Other Ambulatory Visit (HOSPITAL_COMMUNITY): Payer: Self-pay | Admitting: Psychiatry

## 2017-03-23 ENCOUNTER — Encounter (HOSPITAL_COMMUNITY): Payer: Self-pay | Admitting: Psychiatry

## 2017-03-23 ENCOUNTER — Ambulatory Visit (INDEPENDENT_AMBULATORY_CARE_PROVIDER_SITE_OTHER): Payer: Medicare Other | Admitting: Psychiatry

## 2017-03-23 VITALS — BP 147/84 | HR 79 | Ht 69.0 in | Wt 167.0 lb

## 2017-03-23 DIAGNOSIS — F17210 Nicotine dependence, cigarettes, uncomplicated: Secondary | ICD-10-CM | POA: Diagnosis not present

## 2017-03-23 DIAGNOSIS — Z736 Limitation of activities due to disability: Secondary | ICD-10-CM

## 2017-03-23 DIAGNOSIS — F411 Generalized anxiety disorder: Secondary | ICD-10-CM | POA: Diagnosis not present

## 2017-03-23 DIAGNOSIS — F901 Attention-deficit hyperactivity disorder, predominantly hyperactive type: Secondary | ICD-10-CM

## 2017-03-23 DIAGNOSIS — Z818 Family history of other mental and behavioral disorders: Secondary | ICD-10-CM | POA: Diagnosis not present

## 2017-03-23 DIAGNOSIS — Z915 Personal history of self-harm: Secondary | ICD-10-CM

## 2017-03-23 DIAGNOSIS — Z811 Family history of alcohol abuse and dependence: Secondary | ICD-10-CM | POA: Diagnosis not present

## 2017-03-23 MED ORDER — MIRTAZAPINE 15 MG PO TABS: 15.0000 mg | ORAL_TABLET | Freq: Every day | ORAL | 2 refills | Status: DC

## 2017-03-23 MED ORDER — ALPRAZOLAM 1 MG PO TABS: 1.0000 mg | ORAL_TABLET | Freq: Four times a day (QID) | ORAL | 2 refills | Status: DC

## 2017-03-23 MED ORDER — METHYLPHENIDATE HCL 20 MG PO TABS: 20.0000 mg | ORAL_TABLET | Freq: Three times a day (TID) | ORAL | 0 refills | Status: DC

## 2017-03-23 NOTE — Progress Notes (Signed)
BH MD/PA/NP OP Progress Note  03/23/2017 1:14 PM Manuel Kelly  MRN:  [id number]  Chief Complaint:  Chief Complaint    Anxiety; ADHD; Follow-up     WUJ:WJXB patient is a 47 year old divorced white male who lives alone in [place]. He has a 43 year old son. He is on disability  The patient was referred by [facility] for further evaluation of anxiety.  The patient states that he began having issues with depression and anxiety as a younger child. At age 7 he got very depressed after his grandmother died. He was seen at the [facility]. At age 47 he was hospitalized at [facility] after he attempted suicide over breakup with a girlfriend. He continued followup on and off at the mental Health Center. Unfortunately he got heavily into drinking and using drugs primarily cocaine. He was arrested for cocaine possession. He was later charged with possession of a firearm while being a convicted felon and driving without a license and spends some time in the state prison. In the mid 90s he had to go to a rehabilitation center to get off alcohol. He claims that he quit using drugs about 8 years ago.  The patient has had several other times in jail most recently over the summer. These are all related to driving without a license.  Over the years the patient has had several psychiatric diagnoses. He saw Dr. Karn Cassis at [facility] who diagnosed him with ADHD, possible bipolar disorder and generalized anxiety disorder. He admits he was a hyperactive child and couldn't focus and ended up quitting in the ninth grade after repeating the seventh grade twice. He's never been on medication for ADHD. He claims he's had the best response to combination of Xanax and Remeron.  The patient states that over the summer his medications were stopped while he was in jail. Since then he's been extremely anxious and shakes all the time. His stomach is in  knots and he has constant diarrhea. He is unable to sleep. He's been crying a lot and very frustrated. 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 He's had recurrent panic attacks and severe social anxiety. He admits to passive suicidal ideation but with no specific plan and no homicidal ideation. He denies any psychotic symptoms. He has few activities that spends all this time with family and his girlfriend. He also admits to chronic pain in his back from degenerative disc disease and past accident   The patient returns after 2 months.  In general he is doing well.  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  His mood is good he is sleeping fairly well most of the time and the Ritalin continues to help him focus.  He denies significant anxiety or panic attacks. Visit Diagnosis:    ICD-10-CM   1. Generalized anxiety disorder F41.1   2. Attention deficit hyperactivity disorder (ADHD), predominantly hyperactive type F90.1     Past Psychiatric History: Past outpatient treatment  Past Medical History:  Past Medical History:  Diagnosis Date  . Anxiety   . Bulging lumbar disc   . Degenerative arthritis   . GERD (gastroesophageal reflux disease)   . Hypoglycemia     Past Surgical History:  Procedure Laterality Date  . CLAVICLE SURGERY    . HAND SURGERY      Family Psychiatric History: See below  Family History:  Family History  Problem Relation Age of Onset  . Anxiety disorder Mother   . Alcohol abuse Father   . Anxiety disorder Father   . Depression Father   . ADD / ADHD Son     Social History:  Social History   Socioeconomic History  . Marital status: Divorced    Spouse name: None  . Number of children: None  . Years of education: None  . Highest education level: None  Social Needs  . Financial resource strain: None  . Food insecurity - worry: None  . Food insecurity - inability: None  . Transportation needs - medical: None  .  Transportation needs - non-medical: None  Occupational History  . None  Tobacco Use  . Smoking status: Current Every Day Smoker    Packs/day: 1.00    Years: 30.00    Pack years: 30.00  . Smokeless tobacco: Never Used  Substance and Sexual Activity  . Alcohol use: No  . Drug use: No    Comment: per pt he stopped Cocaine 5-6 years ago and did use Marijuana Christmas of 2014  . Sexual activity: Yes    Partners: Female    Birth control/protection: Condom  Other Topics Concern  . None  Social History Narrative  . None    Allergies:  Allergies  Allergen Reactions  . Other     IV Dye, Throat swells up and rash  . Talwin [Pentazocine]     Tongue swell up    Metabolic Disorder Labs: No results found for: HGBA1C, MPG No results found for: PROLACTIN No results found for: CHOL, TRIG, HDL, CHOLHDL, VLDL, LDLCALC No results found for: TSH  Therapeutic Level Labs: No results found for: LITHIUM No results found for: VALPROATE No components found for:  CBMZ  Current Medications: Current Outpatient Medications  Medication Sig Dispense Refill  . ALPRAZolam (XANAX) 1 MG tablet Take 1 tablet (1 mg total) by mouth 4 (four) times daily. 120 tablet 2  . fluticasone (FLONASE) 50 MCG/ACT nasal spray Place 2 sprays into both nostrils 4 (four) times daily.    Marland Kitchen. lovastatin (MEVACOR) 40 MG tablet Take 40 mg by mouth daily.    . methylphenidate (RITALIN) 20 MG tablet Take 1 tablet (20 mg total) by mouth 3 (three) times daily with meals. 90 tablet 0  . methylphenidate (RITALIN) 20 MG tablet Take 1 tablet (20 mg total) by mouth 3 (three) times daily with meals. 90 tablet 0  . methylphenidate (RITALIN) 20 MG tablet Take 1 tablet (20 mg total) by mouth 3 (three) times daily. 90 tablet 0  . mirtazapine (REMERON) 15 MG tablet Take 1 tablet (15 mg total) by mouth at bedtime. 30 tablet 2  . NON FORMULARY ?Cotton Liver Capsules? (Mult.) BID    . omeprazole (PRILOSEC) 40 MG capsule Take 40 mg by mouth  daily.     Marland Kitchen. PROAIR HFA 108 (90 BASE) MCG/ACT inhaler Inhale into the lungs as needed. Reported on 04/29/2015     No current facility-administered medications for this visit.      Musculoskeletal: Strength & Muscle Tone: within normal limits Gait & Station: normal Patient leans: N/A  Psychiatric Specialty Exam: Review of Systems  All other systems reviewed and are negative.   Blood pressure (!) 147/84, pulse 79, height 5\' 9"  (1.753 m), weight 167 lb (75.8 kg), SpO2 99 %.Body mass index is 24.66 kg/m.  General Appearance: Casual and Disheveled  Eye Contact:  Good  Speech:  Clear and Coherent  Volume:  Normal  Mood:  Euthymic  Affect:  Appropriate  Thought Process:  Goal Directed  Orientation:  Full (Time, Place, and Person)  Thought Content: WDL   Suicidal Thoughts:  No  Homicidal Thoughts:  No  Memory:  Immediate;   Good Recent;   Good Remote;   Fair  Judgement:  Fair  Insight:  Fair  Psychomotor Activity:  Normal  Concentration:  Concentration: Good and Attention Span: Good  Recall:  Good  Fund of Knowledge: Good  Language: Good  Akathisia:  No  Handed:  Right  AIMS (if indicated): not done  Assets:  Communication Skills Desire for Improvement Physical Health Resilience Social Support Talents/Skills  ADL's:  Intact  Cognition: WNL  Sleep:  Fair   Screenings:   Assessment and Plan: This patient is a 47 year old male with a history of anxiety and ADHD.  He is doing well on his current regimen.  He will continue mirtazapine 15 mg at bedtime for mood and sleep, methylphenidate 20 mg 3 times daily with meals for focus and Xanax 1 mg 4 times daily for anxiety.  He will return to see me in 3 months   [name], MD 03/23/2017, 1:14 PM

## 2017-06-19 ENCOUNTER — Ambulatory Visit (INDEPENDENT_AMBULATORY_CARE_PROVIDER_SITE_OTHER): Payer: Medicare Other | Admitting: Psychiatry

## 2017-06-19 ENCOUNTER — Encounter (HOSPITAL_COMMUNITY): Payer: Self-pay | Admitting: Psychiatry

## 2017-06-19 VITALS — BP 136/83 | HR 96 | Ht 69.0 in | Wt 166.0 lb

## 2017-06-19 DIAGNOSIS — Z915 Personal history of self-harm: Secondary | ICD-10-CM

## 2017-06-19 DIAGNOSIS — F17210 Nicotine dependence, cigarettes, uncomplicated: Secondary | ICD-10-CM | POA: Diagnosis not present

## 2017-06-19 DIAGNOSIS — Z811 Family history of alcohol abuse and dependence: Secondary | ICD-10-CM | POA: Diagnosis not present

## 2017-06-19 DIAGNOSIS — G4700 Insomnia, unspecified: Secondary | ICD-10-CM

## 2017-06-19 DIAGNOSIS — Z736 Limitation of activities due to disability: Secondary | ICD-10-CM | POA: Diagnosis not present

## 2017-06-19 DIAGNOSIS — F411 Generalized anxiety disorder: Secondary | ICD-10-CM | POA: Diagnosis not present

## 2017-06-19 DIAGNOSIS — Z818 Family history of other mental and behavioral disorders: Secondary | ICD-10-CM | POA: Diagnosis not present

## 2017-06-19 DIAGNOSIS — F901 Attention-deficit hyperactivity disorder, predominantly hyperactive type: Secondary | ICD-10-CM | POA: Diagnosis not present

## 2017-06-19 MED ORDER — ALPRAZOLAM 1 MG PO TABS: 1.0000 mg | ORAL_TABLET | Freq: Four times a day (QID) | ORAL | 2 refills | Status: DC

## 2017-06-19 MED ORDER — TRAZODONE HCL 50 MG PO TABS: 50.0000 mg | ORAL_TABLET | Freq: Every day | ORAL | 2 refills | Status: DC

## 2017-06-19 MED ORDER — METHYLPHENIDATE HCL 20 MG PO TABS: 20.0000 mg | ORAL_TABLET | Freq: Three times a day (TID) | ORAL | 0 refills | Status: DC

## 2017-06-19 NOTE — Progress Notes (Signed)
BH MD/PA/NP OP Progress Note  06/19/2017 1:42 PM Manuel Kelly  MRN:  [id number]  Chief Complaint:  Chief Complaint    Anxiety; ADHD; Follow-up     HPI: this patient is a 47 year old divorced white male who lives alone in [place]. He has a 64 year old son. He is on disability  The patient was referred by [facility] family medicine for further evaluation of anxiety.  The patient states that he began having issues with depression and anxiety as a younger child. At age 59 he got very depressed after his grandmother died. He was seen at the [facility]. At age 23 he was hospitalized at [facility] after he attempted suicide over breakup with a girlfriend. He continued followup on and off at the mental Health Center. Unfortunately he got heavily into drinking and using drugs primarily cocaine. He was arrested for cocaine possession. He was later charged with possession of a firearm while being a convicted felon and driving without a license and spends some time in the state prison. In the mid 90s he had to go to a rehabilitation center to get off alcohol. He claims that he quit using drugs about 8 years ago.  The patient has had several other times in jail most recently over the summer. These are all related to driving without a license.  Over the years the patient has had several psychiatric diagnoses. He saw Dr. Karn Cassis at [facility] who diagnosed him with ADHD, possible bipolar disorder and generalized anxiety disorder. He admits he was a hyperactive child and couldn't focus and ended up quitting in the ninth grade after repeating the seventh grade twice. He's never been on medication for ADHD. He claims he's had the best response to combination of Xanax and Remeron.  The patient states that over the summer his medications were stopped while he was in jail. Since then he's been extremely anxious and shakes all the time. His stomach is in  knots and he has constant diarrhea. He is unable to sleep. He's been crying a lot and very frustrated. 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 He's had recurrent panic attacks and severe social anxiety. He admits to passive suicidal ideation but with no specific plan and no homicidal ideation. He denies any psychotic symptoms. He has few activities that spends all this time with family and his girlfriend. He also admits to chronic pain in his back from degenerative disc disease and past accident   The patient returns after 3 months.  Generally he is doing pretty well.  He has been pressure washing his house and others.  He tries to stay very busy.  He is able to stay focused and his anxiety is under good control.  He denies any symptoms of depression and he is no longer drinking or using any drugs.  He has a new girlfriend that he gets along with quite well He is not sleeping all that well with mirtazapine but agrees to a trial of trazodone   Visit Diagnosis:    ICD-10-CM   1. Generalized anxiety disorder F41.1   2. Attention deficit hyperactivity disorder (ADHD), predominantly hyperactive type F90.1     Past Psychiatric History: Past outpatient treatment  Past Medical History:  Past Medical History:  Diagnosis Date  . Anxiety   . Bulging lumbar disc   . Degenerative arthritis   . GERD (gastroesophageal reflux disease)   . Hypoglycemia  Past Surgical History:  Procedure Laterality Date  . CLAVICLE SURGERY    . HAND SURGERY      Family Psychiatric History: See below  Family History:  Family History  Problem Relation Age of Onset  . Anxiety disorder Mother   . Alcohol abuse Father   . Anxiety disorder Father   . Depression Father   . ADD / ADHD Son     Social History:  Social History   Socioeconomic History  . Marital status: Divorced    Spouse name: Not on file  . Number of children: Not on file  . Years of education: Not on file  . Highest  education level: Not on file  Occupational History  . Not on file  Social Needs  . Financial resource strain: Not on file  . Food insecurity:    Worry: Not on file    Inability: Not on file  . Transportation needs:    Medical: Not on file    Non-medical: Not on file  Tobacco Use  . Smoking status: Current Every Day Smoker    Packs/day: 1.00    Years: 30.00    Pack years: 30.00  . Smokeless tobacco: Never Used  Substance and Sexual Activity  . Alcohol use: No  . Drug use: No    Comment: per pt he stopped Cocaine 5-6 years ago and did use Marijuana Christmas of 2014  . Sexual activity: Yes    Partners: Female    Birth control/protection: Condom  Lifestyle  . Physical activity:    Days per week: Not on file    Minutes per session: Not on file  . Stress: Not on file  Relationships  . Social connections:    Talks on phone: Not on file    Gets together: Not on file    Attends religious service: Not on file    Active member of club or organization: Not on file    Attends meetings of clubs or organizations: Not on file    Relationship status: Not on file  Other Topics Concern  . Not on file  Social History Narrative  . Not on file    Allergies:  Allergies  Allergen Reactions  . Other     IV Dye, Throat swells up and rash  . Talwin [Pentazocine]     Tongue swell up    Metabolic Disorder Labs: No results found for: HGBA1C, MPG No results found for: PROLACTIN No results found for: CHOL, TRIG, HDL, CHOLHDL, VLDL, LDLCALC No results found for: TSH  Therapeutic Level Labs: No results found for: LITHIUM No results found for: VALPROATE No components found for:  CBMZ  Current Medications: Current Outpatient Medications  Medication Sig Dispense Refill  . ALPRAZolam (XANAX) 1 MG tablet Take 1 tablet (1 mg total) by mouth 4 (four) times daily. 120 tablet 2  . fluticasone (FLONASE) 50 MCG/ACT nasal spray Place 2 sprays into both nostrils 4 (four) times daily.    Marland Kitchen  lovastatin (MEVACOR) 40 MG tablet Take 40 mg by mouth daily.    . methylphenidate (RITALIN) 20 MG tablet Take 1 tablet (20 mg total) by mouth 3 (three) times daily with meals. 90 tablet 0  . methylphenidate (RITALIN) 20 MG tablet Take 1 tablet (20 mg total) by mouth 3 (three) times daily with meals. 90 tablet 0  . methylphenidate (RITALIN) 20 MG tablet Take 1 tablet (20 mg total) by mouth 3 (three) times daily. 90 tablet 0  . NON FORMULARY ?Cotton Liver  Capsules? (Mult.) BID    . omeprazole (PRILOSEC) 40 MG capsule Take 40 mg by mouth daily.     Marland Kitchen PROAIR HFA 108 (90 BASE) MCG/ACT inhaler Inhale into the lungs as needed. Reported on 04/29/2015    . traZODone (DESYREL) 50 MG tablet Take 1 tablet (50 mg total) by mouth at bedtime. 30 tablet 2   No current facility-administered medications for this visit.      Musculoskeletal: Strength & Muscle Tone: within normal limits Gait & Station: normal Patient leans: N/A  Psychiatric Specialty Exam: Review of Systems  Psychiatric/Behavioral: The patient has insomnia.   All other systems reviewed and are negative.   Blood pressure 136/83, pulse 96, height  (1.753 m), weight 166 lb (75.3 kg), SpO2 100 %.Body mass index is 24.51 kg/m.  General Appearance: Casual and Disheveled  Eye Contact:  Good  Speech:  Clear and Coherent  Volume:  Normal  Mood:  Euthymic  Affect:  Congruent  Thought Process:  Goal Directed  Orientation:  Full (Time, Place, and Person)  Thought Content: WDL   Suicidal Thoughts:  No  Homicidal Thoughts:  No  Memory:  Immediate;   Good Recent;   Fair Remote;   Fair  Judgement:  Fair  Insight:  Fair  Psychomotor Activity:  Restlessness  Concentration:  Concentration: Good and Attention Span: Good  Recall:  Good  Fund of Knowledge: Good  Language: Good  Akathisia:  No  Handed:  Right  AIMS (if indicated): not done  Assets:  Communication Skills Desire for Improvement Physical Health Resilience Social  Support Talents/Skills  ADL's:  Intact  Cognition: WNL  Sleep:  Poor   Screenings:   Assessment and Plan: This patient is a 47 year old male with a history of ADHD anxiety and poor sleep.  He agrees to a trial of trazodone 50 mg daily for insomnia.  He will continue Xanax 1 mg 4 times a day for anxiety and Ritalin 20 mg 3 times daily with meals for focus.  He will return to see me in 3 months   [name], MD 06/19/2017, 1:42 PM

## 2017-06-21 ENCOUNTER — Ambulatory Visit (HOSPITAL_COMMUNITY): Payer: Self-pay | Admitting: Psychiatry

## 2017-09-12 ENCOUNTER — Other Ambulatory Visit (HOSPITAL_COMMUNITY): Payer: Self-pay | Admitting: Psychiatry

## 2017-09-18 ENCOUNTER — Telehealth (HOSPITAL_COMMUNITY): Payer: Self-pay | Admitting: Psychiatry

## 2017-09-18 ENCOUNTER — Ambulatory Visit (HOSPITAL_COMMUNITY): Payer: Medicare Other | Admitting: Psychiatry

## 2017-09-18 MED ORDER — METHYLPHENIDATE HCL 20 MG PO TABS: 20.0000 mg | ORAL_TABLET | Freq: Three times a day (TID) | ORAL | 0 refills | Status: DC

## 2017-09-18 NOTE — Telephone Encounter (Signed)
Ordered Ritalin refill per request.   I have utilized the [place] Controlled Substances Reporting System (PMP AWARxE) to confirm adherence regarding the patient's medication. My review reveals appropriate prescription fills.

## 2017-09-25 ENCOUNTER — Encounter (HOSPITAL_COMMUNITY): Payer: Self-pay | Admitting: Psychiatry

## 2017-09-25 ENCOUNTER — Ambulatory Visit (INDEPENDENT_AMBULATORY_CARE_PROVIDER_SITE_OTHER): Payer: Medicare Other | Admitting: Psychiatry

## 2017-09-25 VITALS — BP 115/74 | HR 70 | Ht 69.0 in | Wt 170.0 lb

## 2017-09-25 DIAGNOSIS — F17210 Nicotine dependence, cigarettes, uncomplicated: Secondary | ICD-10-CM

## 2017-09-25 DIAGNOSIS — F411 Generalized anxiety disorder: Secondary | ICD-10-CM

## 2017-09-25 DIAGNOSIS — Z818 Family history of other mental and behavioral disorders: Secondary | ICD-10-CM | POA: Diagnosis not present

## 2017-09-25 DIAGNOSIS — G4700 Insomnia, unspecified: Secondary | ICD-10-CM | POA: Diagnosis not present

## 2017-09-25 DIAGNOSIS — F901 Attention-deficit hyperactivity disorder, predominantly hyperactive type: Secondary | ICD-10-CM

## 2017-09-25 DIAGNOSIS — Z811 Family history of alcohol abuse and dependence: Secondary | ICD-10-CM | POA: Diagnosis not present

## 2017-09-25 MED ORDER — ALPRAZOLAM 1 MG PO TABS: ORAL_TABLET | ORAL | 2 refills | Status: DC

## 2017-09-25 MED ORDER — METHYLPHENIDATE HCL 20 MG PO TABS: 20.0000 mg | ORAL_TABLET | Freq: Three times a day (TID) | ORAL | 0 refills | Status: DC

## 2017-09-25 MED ORDER — TRAZODONE HCL 100 MG PO TABS: 100.0000 mg | ORAL_TABLET | Freq: Every day | ORAL | 2 refills | Status: DC

## 2017-09-25 NOTE — Progress Notes (Signed)
BH MD/PA/NP OP Progress Note  09/25/2017 10:33 AM KASTIN CERDA  MRN:  [id number]  Chief Complaint:  Chief Complaint    Depression; Anxiety; ADHD; Follow-up     HPI: this patient is a 47 year old divorced white male who lives alone in [place]. He has a 31 year old son. He is on disability  The patient was referred by [facility] family medicine for further evaluation of anxiety.  The patient states that he began having issues with depression and anxiety as a younger child. At age 17 he got very depressed after his grandmother died. He was seen at the [facility]. At age 61 he was hospitalized at [facility] after he attempted suicide over breakup with a girlfriend. He continued followup on and off at the mental Health Center. Unfortunately he got heavily into drinking and using drugs primarily cocaine. He was arrested for cocaine possession. He was later charged with possession of a firearm while being a convicted felon and driving without a license and spends some time in the state prison. In the mid 90s he had to go to a rehabilitation center to get off alcohol. He claims that he quit using drugs about 8 years ago.  The patient has had several other times in jail most recently over the summer. These are all related to driving without a license.  Over the years the patient has had several psychiatric diagnoses. He saw Dr. Karn Cassis at [facility] who diagnosed him with ADHD, possible bipolar disorder and generalized anxiety disorder. He admits he was a hyperactive child and couldn't focus and ended up quitting in the ninth grade after repeating the seventh grade twice. He's never been on medication for ADHD. He claims he's had the best response to combination of Xanax and Remeron.  The patient states that over the summer his medications were stopped while he was in jail. Since then he's been extremely anxious and shakes all the time. His  stomach is in knots and he has constant diarrhea. He is unable to sleep. He's been crying a lot and very frustrated. 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 He's had recurrent panic attacks and severe social anxiety. He admits to passive suicidal ideation but with no specific plan and no homicidal ideation. He denies any psychotic symptoms. He has few activities that spends all this time with family and his girlfriend. He also admits to chronic pain in his back from degenerative disc disease and past accident  The patient returns after 3 months.  He has had a difficult time recently.  His mother's had several car accidents and told her vehicle.  2 weekends ago he witnessed the death of his father-in-law suddenly while the family was together for a river trip.  They tried to do CPR but to no avail.  This is still haunting him although he knows he did everything he could.  Not been sleeping well and is only getting 2 to 3 hours of sleep at night.  I suggested we increase his trazodone.  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  The Xanax still helps his anxiety and Adderall helps with his focus.  Visit Diagnosis:    ICD-10-CM   1. Generalized anxiety disorder F41.1   2. Attention deficit hyperactivity disorder (ADHD), predominantly hyperactive type F90.1     Past Psychiatric History: Past outpatient treatment  Past Medical History:  Past Medical History:  Diagnosis Date  . Anxiety   . Bulging lumbar disc   . Degenerative arthritis   . GERD (gastroesophageal reflux disease)   . Hypoglycemia     Past Surgical History:  Procedure Laterality Date  . CLAVICLE SURGERY    . HAND SURGERY      Family Psychiatric History: See below  Family History:  Family History  Problem Relation Age of Onset  . Anxiety disorder Mother   . Alcohol abuse Father   . Anxiety disorder Father   . Depression Father   . ADD / ADHD Son     Social History:  Social History    Socioeconomic History  . Marital status: Divorced    Spouse name: Not on file  . Number of children: Not on file  . Years of education: Not on file  . Highest education level: Not on file  Occupational History  . Not on file  Social Needs  . Financial resource strain: Not on file  . Food insecurity:    Worry: Not on file    Inability: Not on file  . Transportation needs:    Medical: Not on file    Non-medical: Not on file  Tobacco Use  . Smoking status: Current Every Day Smoker    Packs/day: 1.00    Years: 30.00    Pack years: 30.00  . Smokeless tobacco: Never Used  Substance and Sexual Activity  . Alcohol use: No  . Drug use: No    Comment: per pt he stopped Cocaine 5-6 years ago and did use Marijuana Christmas of 2014  . Sexual activity: Yes    Partners: Female    Birth control/protection: Condom  Lifestyle  . Physical activity:    Days per week: Not on file    Minutes per session: Not on file  . Stress: Not on file  Relationships  . Social connections:    Talks on phone: Not on file    Gets together: Not on file    Attends religious service: Not on file    Active member of club or organization: Not on file    Attends meetings of clubs or organizations: Not on file    Relationship status: Not on file  Other Topics Concern  . Not on file  Social History Narrative  . Not on file    Allergies:  Allergies  Allergen Reactions  . Other     IV Dye, Throat swells up and rash  . Talwin [Pentazocine]     Tongue swell up    Metabolic Disorder Labs: No results found for: HGBA1C, MPG No results found for: PROLACTIN No results found for: CHOL, TRIG, HDL, CHOLHDL, VLDL, LDLCALC No results found for: TSH  Therapeutic Level Labs: No results found for: LITHIUM No results found for: VALPROATE No components found for:  CBMZ  Current Medications: Current Outpatient Medications  Medication Sig Dispense Refill  . ALPRAZolam (XANAX) 1 MG tablet TAKE (1) TABLET BY  MOUTH FOUR TIMES DAILY 120 tablet 2  . fluticasone (FLONASE) 50 MCG/ACT nasal spray Place 2 sprays into both nostrils 4 (four) times daily.    Marland Kitchen lovastatin (MEVACOR) 40 MG tablet Take 40 mg by mouth daily.    . methylphenidate (RITALIN) 20 MG tablet Take 1 tablet (20 mg total) by mouth 3 (three) times daily with meals. 90 tablet 0  . methylphenidate (RITALIN) 20 MG tablet Take 1 tablet (20 mg total) by mouth 3 (three) times daily. 90 tablet 0  . methylphenidate (  RITALIN) 20 MG tablet Take 1 tablet (20 mg total) by mouth 3 (three) times daily with meals. 90 tablet 0  . NON FORMULARY ?Cotton Liver Capsules? (Mult.) BID    . omeprazole (PRILOSEC) 40 MG capsule Take 40 mg by mouth daily.     Marland Kitchen. PROAIR HFA 108 (90 BASE) MCG/ACT inhaler Inhale into the lungs as needed. Reported on 04/29/2015    . traZODone (DESYREL) 100 MG tablet Take 1 tablet (100 mg total) by mouth at bedtime. 30 tablet 2   No current facility-administered medications for this visit.      Musculoskeletal: Strength & Muscle Tone: within normal limits Gait & Station: normal Patient leans: N/A  Psychiatric Specialty Exam: Review of Systems  Psychiatric/Behavioral: The patient has insomnia.   All other systems reviewed and are negative.   Blood pressure 115/74, pulse 70, height 5\' 9"  (1.753 m), weight 170 lb (77.1 kg), SpO2 100 %.Body mass index is 25.1 kg/m.  General Appearance: Casual and Fairly Groomed  Eye Contact:  Good  Speech:  Clear and Coherent  Volume:  Normal  Mood:  Dysphoric  Affect:  Constricted  Thought Process:  Goal Directed  Orientation:  Full (Time, Place, and Person)  Thought Content: Rumination   Suicidal Thoughts:  No  Homicidal Thoughts:  No  Memory:  Immediate;   Good Recent;   Good Remote;   Fair  Judgement:  Fair  Insight:  Fair  Psychomotor Activity:  Normal  Concentration:  Concentration: Good and Attention Span: Good  Recall:  Good  Fund of Knowledge: Good  Language: Good  Akathisia:   No  Handed:  Right  AIMS (if indicated): not done  Assets:  Communication Skills Desire for Improvement Physical Health Resilience Social Support Talents/Skills  ADL's:  Intact  Cognition: WNL  Sleep:  Poor   Screenings:   Assessment and Plan: This patient is a 47 year old male with a history of ADHD and anxiety.  He just went through rather traumatic experience and is not sleeping well.  He will increase trazodone to 100 mg at bedtime for sleep.  He will continue Xanax 1 mg 4 times daily for anxiety and methylphenidate 20 mg 3 times daily with meals for focus.  He will return to see me in 3 months.   Diannia Rudereborah Everlee Quakenbush, MD 09/25/2017, 10:33 AM

## 2017-12-26 ENCOUNTER — Encounter (HOSPITAL_COMMUNITY): Payer: Self-pay | Admitting: Psychiatry

## 2017-12-26 ENCOUNTER — Ambulatory Visit (INDEPENDENT_AMBULATORY_CARE_PROVIDER_SITE_OTHER): Payer: Medicare PPO | Admitting: Psychiatry

## 2017-12-26 VITALS — BP 126/79 | HR 70 | Ht 69.0 in | Wt 175.0 lb

## 2017-12-26 DIAGNOSIS — F411 Generalized anxiety disorder: Secondary | ICD-10-CM | POA: Diagnosis not present

## 2017-12-26 DIAGNOSIS — F901 Attention-deficit hyperactivity disorder, predominantly hyperactive type: Secondary | ICD-10-CM

## 2017-12-26 MED ORDER — ALPRAZOLAM 1 MG PO TABS: ORAL_TABLET | ORAL | 2 refills | Status: DC

## 2017-12-26 MED ORDER — TRAZODONE HCL 100 MG PO TABS: 100.0000 mg | ORAL_TABLET | Freq: Every day | ORAL | 2 refills | Status: DC

## 2017-12-26 MED ORDER — METHYLPHENIDATE HCL 20 MG PO TABS: 20.0000 mg | ORAL_TABLET | Freq: Three times a day (TID) | ORAL | 0 refills | Status: DC

## 2017-12-26 NOTE — Progress Notes (Signed)
BH MD/PA/NP OP Progress Note  12/26/2017 10:20 AM Manuel Kelly  MRN:  [id number]  Chief Complaint:  Chief Complaint    Anxiety; ADHD; Follow-up     HPI:  this patient is a 47 year old divorced white male who lives alone in [place]. He has a 8 year old son. He is on disability  The patient was referred by [facility] family medicine for further evaluation of anxiety.  The patient states that he began having issues with depression and anxiety as a younger child. At age 24 he got very depressed after his grandmother died. He was seen at the [facility]. At age 34 he was hospitalized at [facility] after he attempted suicide over breakup with a girlfriend. He continued followup on and off at the mental Health Center. Unfortunately he got heavily into drinking and using drugs primarily cocaine. He was arrested for cocaine possession. He was later charged with possession of a firearm while being a convicted felon and driving without a license and spends some time in the state prison. In the mid 90s he had to go to a rehabilitation center to get off alcohol. He claims that he quit using drugs about 8 years ago.  The patient has had several other times in jail most recently over the summer. These are all related to driving without a license.  Over the years the patient has had several psychiatric diagnoses. He saw Dr. Karn Cassis at [facility] who diagnosed him with ADHD, possible bipolar disorder and generalized anxiety disorder. He admits he was a hyperactive child and couldn't focus and ended up quitting in the ninth grade after repeating the seventh grade twice. He's never been on medication for ADHD. He claims he's had the best response to combination of Xanax and Remeron.  The patient states that over the summer his medications were stopped while he was in jail. Since then he's been extremely anxious and shakes all the time. His stomach is  in knots and he has constant diarrhea. He is unable to sleep. He's been crying a lot and very frustrated. 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 He's had recurrent panic attacks and severe social anxiety. He admits to passive suicidal ideation but with no specific plan and no homicidal ideation. He denies any psychotic symptoms. He has few activities that spends all this time with family and his girlfriend. He also admits to chronic pain in his back from degenerative disc disease and past accident  The patient returns after 3 months.  For the most part he has been doing okay.  His sleep is still a bit on and off and sometimes he wakes up and "cold sweats."  He thinks he has had nightmares but he does not remember any of them.  His focus is fairly good and his anxiety is under good control.  He denies being depressed or suicidal.  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 Visit Diagnosis:    ICD-10-CM   1. Generalized anxiety disorder F41.1   2. Attention deficit hyperactivity disorder (ADHD), predominantly hyperactive type F90.1     Past Psychiatric History: Past outpatient treatment  Past Medical History:  Past Medical History:  Diagnosis Date  . Anxiety   . Bulging lumbar disc   . Degenerative arthritis   . GERD (gastroesophageal reflux disease)   .  Hypoglycemia     Past Surgical History:  Procedure Laterality Date  . CLAVICLE SURGERY    . HAND SURGERY      Family Psychiatric History: See below  Family History:  Family History  Problem Relation Age of Onset  . Anxiety disorder Mother   . Alcohol abuse Father   . Anxiety disorder Father   . Depression Father   . ADD / ADHD Son     Social History:  Social History   Socioeconomic History  . Marital status: Divorced    Spouse name: Not on file  . Number of children: Not on file  . Years of education: Not  on file  . Highest education level: Not on file  Occupational History  . Not on file  Social Needs  . Financial resource strain: Not on file  . Food insecurity:    Worry: Not on file    Inability: Not on file  . Transportation needs:    Medical: Not on file    Non-medical: Not on file  Tobacco Use  . Smoking status: Current Every Day Smoker    Packs/day: 1.00    Years: 30.00    Pack years: 30.00  . Smokeless tobacco: Never Used  Substance and Sexual Activity  . Alcohol use: No  . Drug use: No    Comment: per pt he stopped Cocaine 5-6 years ago and did use Marijuana Christmas of 2014  . Sexual activity: Yes    Partners: Female    Birth control/protection: Condom  Lifestyle  . Physical activity:    Days per week: Not on file    Minutes per session: Not on file  . Stress: Not on file  Relationships  . Social connections:    Talks on phone: Not on file    Gets together: Not on file    Attends religious service: Not on file    Active member of club or organization: Not on file    Attends meetings of clubs or organizations: Not on file    Relationship status: Not on file  Other Topics Concern  . Not on file  Social History Narrative  . Not on file    Allergies:  Allergies  Allergen Reactions  . Other     IV Dye, Throat swells up and rash  . Talwin [Pentazocine]     Tongue swell up    Metabolic Disorder Labs: No results found for: HGBA1C, MPG No results found for: PROLACTIN No results found for: CHOL, TRIG, HDL, CHOLHDL, VLDL, LDLCALC No results found for: TSH  Therapeutic Level Labs: No results found for: LITHIUM No results found for: VALPROATE No components found for:  CBMZ  Current Medications: Current Outpatient Medications  Medication Sig Dispense Refill  . ALPRAZolam (XANAX) 1 MG tablet TAKE (1) TABLET BY MOUTH FOUR TIMES DAILY 120 tablet 2  . fluticasone (FLONASE) 50 MCG/ACT nasal spray Place 2 sprays into both nostrils 4 (four) times daily.    Marland Kitchen  lovastatin (MEVACOR) 40 MG tablet Take 40 mg by mouth daily.    . methylphenidate (RITALIN) 20 MG tablet Take 1 tablet (20 mg total) by mouth 3 (three) times daily with meals. 90 tablet 0  . methylphenidate (RITALIN) 20 MG tablet Take 1 tablet (20 mg total) by mouth 3 (three) times daily. 90 tablet 0  . NON FORMULARY ?Cotton Liver Capsules? (Mult.) BID    . omeprazole (PRILOSEC) 40 MG capsule Take 40 mg by mouth daily.     Marland Kitchen  PROAIR HFA 108 (90 BASE) MCG/ACT inhaler Inhale into the lungs as needed. Reported on 04/29/2015    . traZODone (DESYREL) 100 MG tablet Take 1 tablet (100 mg total) by mouth at bedtime. 30 tablet 2  . methylphenidate (RITALIN) 20 MG tablet Take 1 tablet (20 mg total) by mouth 3 (three) times daily with meals. 90 tablet 0   No current facility-administered medications for this visit.      Musculoskeletal: Strength & Muscle Tone: within normal limits Gait & Station: normal Patient leans: N/A  Psychiatric Specialty Exam: Review of Systems  All other systems reviewed and are negative.   Blood pressure 126/79, pulse 70, height 5\' 9"  (1.753 m), weight 175 lb (79.4 kg), SpO2 98 %.Body mass index is 25.84 kg/m.  General Appearance: Casual and Fairly Groomed  Eye Contact:  Good  Speech:  Clear and Coherent  Volume:  Normal  Mood:  Euthymic  Affect:  Appropriate and Congruent  Thought Process:  Goal Directed  Orientation:  Full (Time, Place, and Person)  Thought Content: Rumination   Suicidal Thoughts:  No  Homicidal Thoughts:  No  Memory:  Immediate;   Good Recent;   Good Remote;   Fair  Judgement:  Fair  Insight:  Shallow  Psychomotor Activity:  Normal  Concentration:  Concentration: Good and Attention Span: Good  Recall:  Good  Fund of Knowledge: Fair  Language: Good  Akathisia:  No  Handed:  Right  AIMS (if indicated): not done  Assets:  Communication Skills Desire for Improvement Physical Health Resilience Social Support Talents/Skills  ADL's:   Intact  Cognition: WNL  Sleep:  Fair   Screenings:   Assessment and Plan: This patient is a 47 year old male with a history of remote substance abuse, ADHD anxiety and insomnia.  He states he is doing fairly well on his current regimen.  He has been more tired recently and I suggested he get his yearly physical in.  He will continue trazodone 100 mg at bedtime for sleep, methylphenidate 20 mg 3 times daily for focus and Xanax 1 mg 4 times daily for anxiety.  He will return to see me in 3 months   [name], MD 12/26/2017, 10:20 AM

## 2018-02-23 ENCOUNTER — Other Ambulatory Visit (HOSPITAL_COMMUNITY): Payer: Self-pay | Admitting: Psychiatry

## 2018-02-23 ENCOUNTER — Other Ambulatory Visit (HOSPITAL_COMMUNITY): Payer: Self-pay

## 2018-02-23 MED ORDER — METHYLPHENIDATE HCL 20 MG PO TABS: 20.0000 mg | ORAL_TABLET | Freq: Three times a day (TID) | ORAL | 0 refills | Status: DC

## 2018-02-23 NOTE — Telephone Encounter (Signed)
Medication management - Telephone call with Wess Botts, pharmacist at [facility] to inform Dr. Tenny Craw authorized pt to refill his Ritalin today, one day early. Patient still has one new order she sent in today to go on file and medication to be filled this date.  Called patient back to inform the medication was being filled as approved by Dr. Tenny Craw this day, one day early.

## 2018-02-23 NOTE — Telephone Encounter (Signed)
Sent to [facility] in [place]

## 2018-02-23 NOTE — Telephone Encounter (Signed)
Medication management - Patient called back and stated he does not need a new order but for [place] to call to inform the pharmacy they can go ahead and refill the medication that is set for refill on 02/24/18.  Agreed to send request to Dr. Tenny Craw to authorize.

## 2018-02-23 NOTE — Telephone Encounter (Signed)
Medication management - Attempted to call pt back after he left a message he needs his Ritalin refilled.  Stated he is staying with his Mother in the hospital in [place] at [facility], there were 31 days in December and needs filled ASAP.

## 2018-03-28 ENCOUNTER — Encounter (HOSPITAL_COMMUNITY): Payer: Self-pay | Admitting: Psychiatry

## 2018-03-28 ENCOUNTER — Ambulatory Visit (INDEPENDENT_AMBULATORY_CARE_PROVIDER_SITE_OTHER): Payer: Medicare PPO | Admitting: Psychiatry

## 2018-03-28 VITALS — BP 114/74 | HR 94 | Ht 69.0 in | Wt 168.0 lb

## 2018-03-28 DIAGNOSIS — F411 Generalized anxiety disorder: Secondary | ICD-10-CM

## 2018-03-28 DIAGNOSIS — F901 Attention-deficit hyperactivity disorder, predominantly hyperactive type: Secondary | ICD-10-CM | POA: Diagnosis not present

## 2018-03-28 MED ORDER — METHYLPHENIDATE HCL 20 MG PO TABS: 20.0000 mg | ORAL_TABLET | Freq: Three times a day (TID) | ORAL | 0 refills | Status: DC

## 2018-03-28 MED ORDER — TRAZODONE HCL 100 MG PO TABS: 200.0000 mg | ORAL_TABLET | Freq: Every day | ORAL | 2 refills | Status: DC

## 2018-03-28 MED ORDER — ALPRAZOLAM 1 MG PO TABS: ORAL_TABLET | ORAL | 2 refills | Status: DC

## 2018-03-28 NOTE — Progress Notes (Signed)
BH MD/PA/NP OP Progress Note  03/28/2018 10:26 AM Manuel Kelly  MRN:  [id number]  Chief Complaint:  Chief Complaint    Anxiety; Depression; ADHD; Follow-up     HPI:  this patient is a 48 year old divorced white male who lives alone in [place]. He has a 48 year old son. He is on disability  The patient was referred by [facility] medicine for further evaluation of anxiety.  The patient states that he began having issues with depression and anxiety as a younger child. At age 48 he got very depressed after his grandmother died. He was seen at [facility]. At age 48 he was hospitalized at [facility] after he attempted suicide over breakup with a girlfriend. He continued followup on and off at the mental Health Center. Unfortunately he got heavily into drinking and using drugs primarily cocaine. He was arrested for cocaine possession. He was later charged with possession of a firearm while being a convicted felon and driving without a license and spends some time in the state prison. In the mid 90s he had to go to a rehabilitation center to get off alcohol. He claims that he quit using drugs about 8 years ago.  The patient has had several other times in jail most recently over the summer. These are all related to driving without a license.  Over the years the patient has had several psychiatric diagnoses. He saw Dr. Karn Cassiseikel at [facility] who diagnosed him with ADHD, possible bipolar disorder and generalized anxiety disorder. He admits he was a hyperactive child and couldn't focus and ended up quitting in the ninth grade after repeating the seventh grade twice. He's never been on medication for ADHD. He claims he's had the best response to combination of Xanax and Remeron.  The patient states that over the summer his medications were stopped while he was in jail. Since then he's been extremely anxious and shakes all the time. His  stomach is in knots and he has constant diarrhea. He is unable to sleep. He's been crying a lot and very frustrated. 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 He's had recurrent panic attacks and severe social anxiety. He admits to passive suicidal ideation but with no specific plan and no homicidal ideation. He denies any psychotic symptoms. He has few activities that spends all this time with family and his girlfriend. He also admits to chronic pain in his back from degenerative disc disease and past accident  The patient returns after 3 months.  He is very distraught today.  His mother has developed some sort of delirium.  This started back in December when she woke up confused and babbling.  At first it was thought that she had a stroke.  She has been in [facility] most of the intervening time.  She has had numerous MRIs and it was thought that she had encephalitis or meningitis but none of this panned out.  She eventually went to a nursing home but then went right back to the hospital a couple of days ago with sepsis.  The patient is spending all his free time at the hospital with his mother or taking care of his granddaughter.  His brother is disabled from a motorcycle accident and had been living with the mother and now he is trying to take care of all the household bills for both homes.  He is tearful today and states he is  not able to sleep.  I did increase his trazodone to 200 mg daily.  He is trying to do everything he can to keep everything going. Visit Diagnosis:    ICD-10-CM   1. Generalized anxiety disorder F41.1   2. Attention deficit hyperactivity disorder (ADHD), predominantly hyperactive type F90.1     Past Psychiatric History: Past outpatient treatment  Past Medical History:  Past Medical History:  Diagnosis Date  . Anxiety   . Bulging lumbar disc   . Degenerative arthritis   . GERD (gastroesophageal reflux disease)   . Hypoglycemia     Past  Surgical History:  Procedure Laterality Date  . CLAVICLE SURGERY    . HAND SURGERY      Family Psychiatric History: See below  Family History:  Family History  Problem Relation Age of Onset  . Anxiety disorder Mother   . Alcohol abuse Father   . Anxiety disorder Father   . Depression Father   . ADD / ADHD Son     Social History:  Social History   Socioeconomic History  . Marital status: Divorced    Spouse name: Not on file  . Number of children: Not on file  . Years of education: Not on file  . Highest education level: Not on file  Occupational History  . Not on file  Social Needs  . Financial resource strain: Not on file  . Food insecurity:    Worry: Not on file    Inability: Not on file  . Transportation needs:    Medical: Not on file    Non-medical: Not on file  Tobacco Use  . Smoking status: Current Every Day Smoker    Packs/day: 1.00    Years: 30.00    Pack years: 30.00  . Smokeless tobacco: Never Used  Substance and Sexual Activity  . Alcohol use: No  . Drug use: No    Comment: per pt he stopped Cocaine 5-6 years ago and did use Marijuana Christmas of 2014  . Sexual activity: Yes    Partners: Female    Birth control/protection: Condom  Lifestyle  . Physical activity:    Days per week: Not on file    Minutes per session: Not on file  . Stress: Not on file  Relationships  . Social connections:    Talks on phone: Not on file    Gets together: Not on file    Attends religious service: Not on file    Active member of club or organization: Not on file    Attends meetings of clubs or organizations: Not on file    Relationship status: Not on file  Other Topics Concern  . Not on file  Social History Narrative  . Not on file    Allergies:  Allergies  Allergen Reactions  . Other     IV Dye, Throat swells up and rash  . Talwin [Pentazocine]     Tongue swell up    Metabolic Disorder Labs: No results found for: HGBA1C, MPG No results found for:  PROLACTIN No results found for: CHOL, TRIG, HDL, CHOLHDL, VLDL, LDLCALC No results found for: TSH  Therapeutic Level Labs: No results found for: LITHIUM No results found for: VALPROATE No components found for:  CBMZ  Current Medications: Current Outpatient Medications  Medication Sig Dispense Refill  . ALPRAZolam (XANAX) 1 MG tablet TAKE (1) TABLET BY MOUTH FOUR TIMES DAILY 120 tablet 2  . fluticasone (FLONASE) 50 MCG/ACT nasal spray Place 2 sprays into both  nostrils 4 (four) times daily.    Marland Kitchen. lovastatin (MEVACOR) 40 MG tablet Take 40 mg by mouth daily.    . methylphenidate (RITALIN) 20 MG tablet Take 1 tablet (20 mg total) by mouth 3 (three) times daily with meals. 90 tablet 0  . methylphenidate (RITALIN) 20 MG tablet Take 1 tablet (20 mg total) by mouth 3 (three) times daily. 90 tablet 0  . NON FORMULARY ?Cotton Liver Capsules? (Mult.) BID    . omeprazole (PRILOSEC) 40 MG capsule Take 40 mg by mouth daily.     Marland Kitchen. PROAIR HFA 108 (90 BASE) MCG/ACT inhaler Inhale into the lungs as needed. Reported on 04/29/2015    . methylphenidate (RITALIN) 20 MG tablet Take 1 tablet (20 mg total) by mouth 3 (three) times daily with meals for 30 days. 90 tablet 0  . traZODone (DESYREL) 100 MG tablet Take 2 tablets (200 mg total) by mouth at bedtime. 60 tablet 2   No current facility-administered medications for this visit.      Musculoskeletal: Strength & Muscle Tone: within normal limits Gait & Station: normal Patient leans: N/A  Psychiatric Specialty Exam: Review of Systems  Psychiatric/Behavioral: The patient is nervous/anxious.   All other systems reviewed and are negative.   Blood pressure 114/74, pulse 94, height 5\' 9"  (1.753 m), weight 168 lb (76.2 kg), SpO2 100 %.Body mass index is 24.81 kg/m.  General Appearance: Casual and Fairly Groomed  Eye Contact:  Good  Speech:  Pressured  Volume:  Normal  Mood:  Anxious  Affect:  Congruent  Thought Process:  Goal Directed  Orientation:   Full (Time, Place, and Person)  Thought Content: Rumination   Suicidal Thoughts:  No  Homicidal Thoughts:  No  Memory:  Immediate;   Good Recent;   Good Remote;   Good  Judgement:  Good  Insight:  Fair  Psychomotor Activity:  Restlessness  Concentration:  Concentration: Good and Attention Span: Good  Recall:  Good  Fund of Knowledge: Fair  Language: Good  Akathisia:  No  Handed:  Right  AIMS (if indicated): not done  Assets:  Communication Skills Desire for Improvement Physical Health Resilience Social Support Talents/Skills  ADL's:  Intact  Cognition: WNL  Sleep:  Poor   Screenings:   Assessment and Plan:  This patient is a 48 year old male with a history of ADHD anxiety and difficulty sleeping.  He is very distraught today and worried about his mother's decline.  We will increase trazodone to 200 mg at bedtime to help with sleep.  He will continue Xanax 1 mg 4 times daily for anxiety and Ritalin 20 mg 3 times daily for focus.  He will return to see me in 3 months  Diannia Rudereborah Hannah Crill, MD 03/28/2018, 10:26 AM

## 2018-06-22 ENCOUNTER — Other Ambulatory Visit (HOSPITAL_COMMUNITY): Payer: Self-pay | Admitting: Psychiatry

## 2018-06-26 ENCOUNTER — Ambulatory Visit (INDEPENDENT_AMBULATORY_CARE_PROVIDER_SITE_OTHER): Payer: Medicare PPO | Admitting: Psychiatry

## 2018-06-26 ENCOUNTER — Encounter (HOSPITAL_COMMUNITY): Payer: Self-pay | Admitting: Psychiatry

## 2018-06-26 ENCOUNTER — Other Ambulatory Visit: Payer: Self-pay

## 2018-06-26 DIAGNOSIS — F901 Attention-deficit hyperactivity disorder, predominantly hyperactive type: Secondary | ICD-10-CM | POA: Diagnosis not present

## 2018-06-26 DIAGNOSIS — F411 Generalized anxiety disorder: Secondary | ICD-10-CM

## 2018-06-26 MED ORDER — METHYLPHENIDATE HCL 20 MG PO TABS: 20.0000 mg | ORAL_TABLET | Freq: Three times a day (TID) | ORAL | 0 refills | Status: DC

## 2018-06-26 MED ORDER — ALPRAZOLAM 1 MG PO TABS: 1.0000 mg | ORAL_TABLET | Freq: Four times a day (QID) | ORAL | 2 refills | Status: DC

## 2018-06-26 MED ORDER — BUPROPION HCL ER (XL) 150 MG PO TB24: 150.0000 mg | ORAL_TABLET | ORAL | 2 refills | Status: DC

## 2018-06-26 MED ORDER — TEMAZEPAM 30 MG PO CAPS: 30.0000 mg | ORAL_CAPSULE | Freq: Every evening | ORAL | 2 refills | Status: DC | PRN

## 2018-06-26 NOTE — Progress Notes (Signed)
Virtual Visit via Telephone Note  I connected with Manuel Kelly on 06/26/18 at 10:00 AM EDT by telephone and verified that I am speaking with the correct person using two identifiers.   I discussed the limitations, risks, security and privacy concerns of performing an evaluation and management service by telephone and the availability of in person appointments. I also discussed with the patient that there may be a patient responsible charge related to this service. The patient expressed understanding and agreed to proceed.       I discussed the assessment and treatment plan with the patient. The patient was provided an opportunity to ask questions and all were answered. The patient agreed with the plan and demonstrated an understanding of the instructions.   The patient was advised to call back or seek an in-person evaluation if the symptoms worsen or if the condition fails to improve as anticipated.  I provided 15 minutes of non-face-to-face time during this encounter.   [name], MD  [facility] MD/PA/NP OP Progress Note  06/26/2018 10:50 AM Manuel Kelly  MRN:  [id number]  Chief Complaint:  Chief Complaint    Depression; Anxiety; Follow-up     HPI: this patient is a 48 year old divorced white male who lives alone in [place]. He has a 43 year old son. He is on disability  The patient was referred by [facility] for further evaluation of anxiety.  The patient states that he began having issues with depression and anxiety as a younger child. At age 66 he got very depressed after his grandmother died. He was seen at the [facility]. At age 50 he was hospitalized at [facility] after he attempted suicide over breakup with a girlfriend. He continued followup on and off at the mental Health Center. Unfortunately he got heavily into drinking and using drugs primarily cocaine. He was arrested for cocaine possession. He was later charged  with possession of a firearm while being a convicted felon and driving without a license and spends some time in the state prison. In the mid 90s he had to go to a rehabilitation center to get off alcohol. He claims that he quit using drugs about 8 years ago.  The patient has had several other times in jail most recently over the summer. These are all related to driving without a license.  Over the years the patient has had several psychiatric diagnoses. He saw Dr. Karn Cassis at [facility] who diagnosed him with ADHD, possible bipolar disorder and generalized anxiety disorder. He admits he was a hyperactive child and couldn't focus and ended up quitting in the ninth grade after repeating the seventh grade twice. He's never been on medication for ADHD. He claims he's had the best response to combination of Xanax and Remeron.  The patient states that over the summer his medications were stopped while he was in jail. Since then he's been extremely anxious and shakes all the time. His stomach is in knots and he has constant diarrhea. He is unable to sleep. He's been crying a lot and very frustrated. 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 He's had recurrent panic attacks and severe social anxiety. He admits to passive suicidal ideation but with no specific plan and no homicidal ideation. He denies any psychotic symptoms. He has few activities that spends all this time with family and his girlfriend. He also admits to chronic pain in his back from degenerative  disc disease and past accident  The patient returns after 3 months.  He is assessed via telephone due to the coronavirus pandemic.  He states that he lost his mother in February.  Last time I saw him she was hospitalized for sepsis which developed into delirium.  She died about a week later.  After that however his brother had a cerebral aneurysm that burst and he is now on life support in the ICU.  He is a 10  has to make the decision whether or not to leave him on life support and this is been very difficult.  Some of the family members do not want to take him off.  The patient finds that he is more depressed angry and irritable.  He is not getting along very well with his girlfriend.  Even with the increase in trazodone to 200 mg he is not sleeping.  He denies suicidal ideation.  I suggested we switch his trazodone to Restoril and also add an antidepressant.  He states some of the SSRIs made his suicidal thoughts worse so we will try Wellbutrin. Visit Diagnosis:    ICD-10-CM   1. Generalized anxiety disorder F41.1   2. Attention deficit hyperactivity disorder (ADHD), predominantly hyperactive type F90.1     Past Psychiatric History: Past outpatient treatment  Past Medical History:  Past Medical History:  Diagnosis Date  . Anxiety   . Bulging lumbar disc   . Degenerative arthritis   . GERD (gastroesophageal reflux disease)   . Hypoglycemia     Past Surgical History:  Procedure Laterality Date  . CLAVICLE SURGERY    . HAND SURGERY      Family Psychiatric History: see below Family History:  Family History  Problem Relation Age of Onset  . Anxiety disorder Mother   . Alcohol abuse Father   . Anxiety disorder Father   . Depression Father   . ADD / ADHD Son     Social History:  Social History   Socioeconomic History  . Marital status: Divorced    Spouse name: Not on file  . Number of children: Not on file  . Years of education: Not on file  . Highest education level: Not on file  Occupational History  . Not on file  Social Needs  . Financial resource strain: Not on file  . Food insecurity:    Worry: Not on file    Inability: Not on file  . Transportation needs:    Medical: Not on file    Non-medical: Not on file  Tobacco Use  . Smoking status: Current Every Day Smoker    Packs/day: 1.00    Years: 30.00    Pack years: 30.00  . Smokeless tobacco: Never Used  Substance  and Sexual Activity  . Alcohol use: No  . Drug use: No    Comment: per pt he stopped Cocaine 5-6 years ago and did use Marijuana Christmas of 2014  . Sexual activity: Yes    Partners: Female    Birth control/protection: Condom  Lifestyle  . Physical activity:    Days per week: Not on file    Minutes per session: Not on file  . Stress: Not on file  Relationships  . Social connections:    Talks on phone: Not on file    Gets together: Not on file    Attends religious service: Not on file    Active member of club or organization: Not on file    Attends meetings of  clubs or organizations: Not on file    Relationship status: Not on file  Other Topics Concern  . Not on file  Social History Narrative  . Not on file    Allergies:  Allergies  Allergen Reactions  . Other     IV Dye, Throat swells up and rash  . Talwin [Pentazocine]     Tongue swell up    Metabolic Disorder Labs: No results found for: HGBA1C, MPG No results found for: PROLACTIN No results found for: CHOL, TRIG, HDL, CHOLHDL, VLDL, LDLCALC No results found for: TSH  Therapeutic Level Labs: No results found for: LITHIUM No results found for: VALPROATE No components found for:  CBMZ  Current Medications: Current Outpatient Medications  Medication Sig Dispense Refill  . ALPRAZolam (XANAX) 1 MG tablet Take 1 tablet (1 mg total) by mouth 4 (four) times daily. 120 tablet 2  . buPROPion (WELLBUTRIN XL) 150 MG 24 hr tablet Take 1 tablet (150 mg total) by mouth every morning. 30 tablet 2  . fluticasone (FLONASE) 50 MCG/ACT nasal spray Place 2 sprays into both nostrils 4 (four) times daily.    Marland Kitchen lovastatin (MEVACOR) 40 MG tablet Take 40 mg by mouth daily.    . methylphenidate (RITALIN) 20 MG tablet Take 1 tablet (20 mg total) by mouth 3 (three) times daily. 90 tablet 0  . methylphenidate (RITALIN) 20 MG tablet Take 1 tablet (20 mg total) by mouth 3 (three) times daily with meals for 30 days. 90 tablet 0  .  methylphenidate (RITALIN) 20 MG tablet Take 1 tablet (20 mg total) by mouth 3 (three) times daily with meals. 90 tablet 0  . NON FORMULARY ?Cotton Liver Capsules? (Mult.) BID    . omeprazole (PRILOSEC) 40 MG capsule Take 40 mg by mouth daily.     Marland Kitchen PROAIR HFA 108 (90 BASE) MCG/ACT inhaler Inhale into the lungs as needed. Reported on 04/29/2015    . temazepam (RESTORIL) 30 MG capsule Take 1 capsule (30 mg total) by mouth at bedtime as needed for sleep. 30 capsule 2   No current facility-administered medications for this visit.      Musculoskeletal: Strength & Muscle Tone: within normal limits Gait & Station: normal Patient leans: N/A  Psychiatric Specialty Exam: Review of Systems  Psychiatric/Behavioral: Positive for depression. The patient is nervous/anxious and has insomnia.   All other systems reviewed and are negative.   There were no vitals taken for this visit.There is no height or weight on file to calculate BMI.  General Appearance: NA  Eye Contact:  NA  Speech:  Clear and Coherent  Volume:  Normal  Mood:  Depressed and Irritable  Affect:  NA  Thought Process:  Goal Directed  Orientation:  Full (Time, Place, and Person)  Thought Content: Rumination   Suicidal Thoughts:  No  Homicidal Thoughts:  No  Memory:  Immediate;   Good Recent;   Good Remote;   Fair  Judgement:  Fair  Insight:  Fair  Psychomotor Activity:  Decreased  Concentration:  Concentration: Fair and Attention Span: Fair  Recall:  Good  Fund of Knowledge: Fair  Language: Good  Akathisia:  No  Handed:  Right  AIMS (if indicated): not done  Assets:  Communication Skills Desire for Improvement Resilience Social Support Talents/Skills  ADL's:  Intact  Cognition: WNL  Sleep:  Poor   Screenings:   Assessment and Plan: This patient is a 48 year old male with a history of anxiety depression and ADHD.  He is  really struggling right now because he just lost his mother and his brother is on life support  but he has to make a decision about whether or not to continue the process.  He seems much more depressed although not suicidal.  We will add Wellbutrin XL 150 mg daily to his regimen.  He will also discontinue trazodone and start Restoril 30 mg at bedtime for sleep.  He will continue methylphenidate 20 mg 3 times daily for focus, Xanax 1 mg 4 times daily for anxiety.  He will return to see me in 4 weeks or call sooner if needed   [name], MD 06/26/2018, 10:50 AM

## 2018-07-24 ENCOUNTER — Ambulatory Visit (HOSPITAL_COMMUNITY): Payer: Medicare PPO | Admitting: Psychiatry

## 2018-07-24 ENCOUNTER — Other Ambulatory Visit: Payer: Self-pay

## 2018-09-21 ENCOUNTER — Other Ambulatory Visit (HOSPITAL_COMMUNITY): Payer: Self-pay | Admitting: Psychiatry

## 2018-09-26 ENCOUNTER — Other Ambulatory Visit: Payer: Self-pay

## 2018-09-26 ENCOUNTER — Encounter (HOSPITAL_COMMUNITY): Payer: Self-pay | Admitting: Psychiatry

## 2018-09-26 ENCOUNTER — Ambulatory Visit (INDEPENDENT_AMBULATORY_CARE_PROVIDER_SITE_OTHER): Payer: Medicare PPO | Admitting: Psychiatry

## 2018-09-26 DIAGNOSIS — F901 Attention-deficit hyperactivity disorder, predominantly hyperactive type: Secondary | ICD-10-CM

## 2018-09-26 DIAGNOSIS — F329 Major depressive disorder, single episode, unspecified: Secondary | ICD-10-CM | POA: Diagnosis not present

## 2018-09-26 DIAGNOSIS — G4700 Insomnia, unspecified: Secondary | ICD-10-CM | POA: Diagnosis not present

## 2018-09-26 DIAGNOSIS — F411 Generalized anxiety disorder: Secondary | ICD-10-CM

## 2018-09-26 MED ORDER — METHYLPHENIDATE HCL 20 MG PO TABS: 20.0000 mg | ORAL_TABLET | Freq: Three times a day (TID) | ORAL | 0 refills | Status: DC

## 2018-09-26 MED ORDER — ALPRAZOLAM 1 MG PO TABS: ORAL_TABLET | ORAL | 2 refills | Status: DC

## 2018-09-26 MED ORDER — BUPROPION HCL ER (XL) 150 MG PO TB24: 150.0000 mg | ORAL_TABLET | ORAL | 2 refills | Status: DC

## 2018-09-26 NOTE — Progress Notes (Signed)
Virtual Visit via Telephone Note  I connected with Manuel Kelly on 09/26/18 at  2:00 PM EDT by telephone and verified that I am speaking with the correct person using two identifiers.   I discussed the limitations, risks, security and privacy concerns of performing an evaluation and management service by telephone and the availability of in person appointments. I also discussed with the patient that there may be a patient responsible charge related to this service. The patient expressed understanding and agreed to proceed.      I discussed the assessment and treatment plan with the patient. The patient was provided an opportunity to ask questions and all were answered. The patient agreed with the plan and demonstrated an understanding of the instructions.   The patient was advised to call back or seek an in-person evaluation if the symptoms worsen or if the condition fails to improve as anticipated.  I provided 15 minutes of non-face-to-face time during this encounter.   Diannia Rudereborah Ross, MD  [facility] MD/PA/NP OP Progress Note  09/26/2018 2:15 PM Manuel Kelly  MRN:  [id number]  Chief Complaint:  Chief Complaint    Depression; Anxiety; ADHD; Follow-up     HPI: this patient is a 48 year-old divorced white male who lives alone in [place]. He has a 48 year old son. He is on disability  The patient was referred by [facility] for further evaluation of anxiety.  The patient states that he began having issues with depression and anxiety as a younger child. At age [age] he got very depressed after his grandmother died. [facility]. At age 48 he was hospitalized at [facility] after he attempted suicide over breakup with a girlfriend. He continued followup on and off at the mental Health Center. Unfortunately he got heavily into drinking and using drugs primarily cocaine. He was arrested for cocaine possession. He was later charged  with possession of a firearm while being a convicted felon and driving without a license and spends some time in the state prison. In the mid 90s he had to go to a rehabilitation center to get off alcohol. He claims that he quit using drugs about 8 years ago.  The patient has had several other times in jail most recently over the summer. These are all related to driving without a license.  Over the years the patient has had several psychiatric diagnoses. He [facility] who diagnosed him with ADHD, possible bipolar disorder and generalized anxiety disorder. He admits he was a hyperactive child and couldn't focus and ended up quitting in the ninth grade after repeating the seventh grade twice. He's never been on medication for ADHD. He claims he's had the best response to combination of Xanax and Remeron.  The patient states that over the summer his medications were stopped while he was in jail. Since then he's been extremely anxious and shakes all the time. His stomach is in knots and he has constant diarrhea. He is unable to sleep. He's been crying a lot and very frustrated. 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 He's had recurrent panic attacks and severe social anxiety. He admits to passive suicidal ideation but with no specific plan and no homicidal ideation. He denies any psychotic symptoms. He has few activities that spends all this time with family and his girlfriend. He also admits to chronic pain in his back  from degenerative disc disease and past accident  The patient returns for follow-up after 3 months.  He states that he has had a rough year.  He told me last time that his mother had died of sepsis in February.  Shortly thereafter his brother developed a cerebral aneurysm.  His brother lasted until July 5 and then passed away.  The patient states he has been pretty sad and having a very difficult time sleeping.  We tried changing  him from trazodone to Restoril but it does not help.  The only thing that helps is taking 2 Xanax at bedtime but then he has start during the day.  I suggested that at least temporarily he use Xanax 1 mg 3 times a day and 2 at bedtime and I will increase the quantity.  We will prescribe any further sleeping pills.  He denies suicidal ideation but definitely is having good and bad days right now particularly remembering things about his mother and brother.  He is feeling better when he spends time with his son and granddaughter.  His focus is pretty good   Visit Diagnosis:    ICD-10-CM   1. Generalized anxiety disorder  F41.1   2. Attention deficit hyperactivity disorder (ADHD), predominantly hyperactive type  F90.1     Past Psychiatric History: Past outpatient treatment  Past Medical History:  Past Medical History:  Diagnosis Date  . Anxiety   . Bulging lumbar disc   . Degenerative arthritis   . GERD (gastroesophageal reflux disease)   . Hypoglycemia     Past Surgical History:  Procedure Laterality Date  . CLAVICLE SURGERY    . HAND SURGERY      Family Psychiatric History: see below  Family History:  Family History  Problem Relation Age of Onset  . Anxiety disorder Mother   . Alcohol abuse Father   . Anxiety disorder Father   . Depression Father   . ADD / ADHD Son     Social History:  Social History   Socioeconomic History  . Marital status: Divorced    Spouse name: Not on file  . Number of children: Not on file  . Years of education: Not on file  . Highest education level: Not on file  Occupational History  . Not on file  Social Needs  . Financial resource strain: Not on file  . Food insecurity    Worry: Not on file    Inability: Not on file  . Transportation needs    Medical: Not on file    Non-medical: Not on file  Tobacco Use  . Smoking status: Current Every Day Smoker    Packs/day: 1.00    Years: 30.00    Pack years: 30.00  . Smokeless tobacco: Never  Used  Substance and Sexual Activity  . Alcohol use: No  . Drug use: No    Comment: per pt he stopped Cocaine 5-6 years ago and did use Marijuana Christmas of 2014  . Sexual activity: Yes    Partners: Female    Birth control/protection: Condom  Lifestyle  . Physical activity    Days per week: Not on file    Minutes per session: Not on file  . Stress: Not on file  Relationships  . Social Musicianconnections    Talks on phone: Not on file    Gets together: Not on file    Attends religious service: Not on file    Active member of club or organization: Not on file  Attends meetings of clubs or organizations: Not on file    Relationship status: Not on file  Other Topics Concern  . Not on file  Social History Narrative  . Not on file    Allergies:  Allergies  Allergen Reactions  . Other     IV Dye, Throat swells up and rash  . Talwin [Pentazocine]     Tongue swell up    Metabolic Disorder Labs: No results found for: HGBA1C, MPG No results found for: PROLACTIN No results found for: CHOL, TRIG, HDL, CHOLHDL, VLDL, LDLCALC No results found for: TSH  Therapeutic Level Labs: No results found for: LITHIUM No results found for: VALPROATE No components found for:  CBMZ  Current Medications: Current Outpatient Medications  Medication Sig Dispense Refill  . ALPRAZolam (XANAX) 1 MG tablet Take one  Tablet three times daily and two at bedtime 150 tablet 2  . buPROPion (WELLBUTRIN XL) 150 MG 24 hr tablet Take 1 tablet (150 mg total) by mouth every morning. 30 tablet 2  . fluticasone (FLONASE) 50 MCG/ACT nasal spray Place 2 sprays into both nostrils 4 (four) times daily.    Marland Kitchen. lovastatin (MEVACOR) 40 MG tablet Take 40 mg by mouth daily.    . methylphenidate (RITALIN) 20 MG tablet Take 1 tablet (20 mg total) by mouth 3 (three) times daily. 90 tablet 0  . methylphenidate (RITALIN) 20 MG tablet Take 1 tablet (20 mg total) by mouth 3 (three) times daily with meals. 90 tablet 0  .  methylphenidate (RITALIN) 20 MG tablet Take 1 tablet (20 mg total) by mouth 3 (three) times daily with meals. 90 tablet 0  . NON FORMULARY ?Cotton Liver Capsules? (Mult.) BID    . omeprazole (PRILOSEC) 40 MG capsule Take 40 mg by mouth daily.     Marland Kitchen. PROAIR HFA 108 (90 BASE) MCG/ACT inhaler Inhale into the lungs as needed. Reported on 04/29/2015     No current facility-administered medications for this visit.      Musculoskeletal: Strength & Muscle Tone: within normal limits Gait & Station: normal Patient leans: N/A  Psychiatric Specialty Exam: Review of Systems  Psychiatric/Behavioral: Positive for depression. The patient has insomnia.   All other systems reviewed and are negative.   There were no vitals taken for this visit.There is no height or weight on file to calculate BMI.  General Appearance: NA  Eye Contact:  NA  Speech:  Clear and Coherent  Volume:  Normal  Mood:  Dysphoric  Affect:  NA  Thought Process:  Goal Directed  Orientation:  Full (Time, Place, and Person)  Thought Content: Rumination   Suicidal Thoughts:  No  Homicidal Thoughts:  No  Memory:  Immediate;   Good Recent;   Good Remote;   Good  Judgement:  Good  Insight:  Fair  Psychomotor Activity:  Decreased  Concentration:  Concentration: Good and Attention Span: Good  Recall:  Good  Fund of Knowledge: Good  Language: Good  Akathisia:  No  Handed:  Right  AIMS (if indicated): not done  Assets:  Communication Skills Desire for Improvement Physical Health Resilience Social Support Talents/Skills  ADL's:  Intact  Cognition: WNL  Sleep:  Poor   Screenings:   Assessment and Plan: This patient is a 48 year old male with a history of depression anxiety and ADHD.  I have temporarily agreed to allow him to go up to Xanax 1 mg 3 times daily and 2 mg at bedtime to help with sleep.  He will continue  Wellbutrin XL 150 mg daily for depression and methylphenidate 20 mg 3 times daily for ADHD.  He will return  to see me in 3 months   Levonne Spiller, MD 09/26/2018, 2:15 PM

## 2018-09-27 ENCOUNTER — Telehealth (HOSPITAL_COMMUNITY): Payer: Self-pay | Admitting: *Deleted

## 2018-09-27 NOTE — Telephone Encounter (Signed)
[employer] APPROVAL PRESCRIPTION DRUG COVERAGE  APPROVED ALPRAZOLAM 1 MG TABLET  AUTHORIZATION GOOD UNTIL  02/21/2019

## 2018-10-09 ENCOUNTER — Telehealth (HOSPITAL_COMMUNITY): Payer: Self-pay | Admitting: *Deleted

## 2018-10-09 NOTE — Telephone Encounter (Signed)
[employer] PATIENT ID # [id number]  HAS BEEN APPROVED # [id number]  ALPRAZOLAM 1 MG TAB Effective 02-21-2018 thru 02/21/2019 P.A. # : [id number]

## 2018-12-24 ENCOUNTER — Telehealth (HOSPITAL_COMMUNITY): Payer: Self-pay

## 2018-12-24 ENCOUNTER — Other Ambulatory Visit (HOSPITAL_COMMUNITY): Payer: Self-pay | Admitting: Psychiatry

## 2018-12-24 MED ORDER — BUPROPION HCL ER (XL) 150 MG PO TB24: 150.0000 mg | ORAL_TABLET | ORAL | 2 refills | Status: DC

## 2018-12-24 NOTE — Telephone Encounter (Signed)
Please call pt for appt

## 2018-12-24 NOTE — Telephone Encounter (Signed)
Medication management - Telephone call with pt to inform Dr. Harrington Challenger did send in his requested Wellbutrin XL order to his pharmacy + 2 refills.

## 2018-12-24 NOTE — Telephone Encounter (Signed)
Medication management - Telephone call with pt to follow up on his message left for requested medication refills. Informed Dr. Harrington Challenger had already sent in a new Alprazolam and Ritalin order. Pt. returns 01/11/19 and states he will need a refill of his Bupropion prior to appointment on 01/11/19 as well.  Agreed to send request to Dr. Harrington Challenger as this was last ordered 09/26/18 + 2 refills.

## 2018-12-24 NOTE — Telephone Encounter (Signed)
sent 

## 2019-01-11 ENCOUNTER — Ambulatory Visit (HOSPITAL_COMMUNITY): Payer: Medicare PPO | Admitting: Psychiatry

## 2019-01-11 ENCOUNTER — Other Ambulatory Visit: Payer: Self-pay

## 2019-01-23 ENCOUNTER — Other Ambulatory Visit (HOSPITAL_COMMUNITY): Payer: Self-pay | Admitting: Psychiatry

## 2019-01-23 NOTE — Telephone Encounter (Signed)
Call pt for appt 

## 2019-01-23 NOTE — Telephone Encounter (Signed)
LVM PER PROVIDER Call pt for appt. REMINDED OF NO SHOW POLICY. NO SHOWED 01/11/2019 APPT

## 2019-01-24 ENCOUNTER — Other Ambulatory Visit: Payer: Self-pay

## 2019-01-24 ENCOUNTER — Ambulatory Visit (INDEPENDENT_AMBULATORY_CARE_PROVIDER_SITE_OTHER): Payer: Medicare PPO | Admitting: Psychiatry

## 2019-01-24 ENCOUNTER — Encounter (HOSPITAL_COMMUNITY): Payer: Self-pay | Admitting: Psychiatry

## 2019-01-24 DIAGNOSIS — F411 Generalized anxiety disorder: Secondary | ICD-10-CM | POA: Diagnosis not present

## 2019-01-24 DIAGNOSIS — F901 Attention-deficit hyperactivity disorder, predominantly hyperactive type: Secondary | ICD-10-CM

## 2019-01-24 MED ORDER — METHYLPHENIDATE HCL 20 MG PO TABS: 20.0000 mg | ORAL_TABLET | Freq: Three times a day (TID) | ORAL | 0 refills | Status: DC

## 2019-01-24 MED ORDER — BUPROPION HCL ER (XL) 150 MG PO TB24: 150.0000 mg | ORAL_TABLET | ORAL | 2 refills | Status: DC

## 2019-01-24 MED ORDER — ALPRAZOLAM 1 MG PO TABS: ORAL_TABLET | ORAL | 2 refills | Status: DC

## 2019-01-24 NOTE — Progress Notes (Signed)
Virtual Visit via Telephone Note  I connected with Manuel Kelly on 01/24/19 at  9:40 AM EST by telephone and verified that I am speaking with the correct person using two identifiers.   I discussed the limitations, risks, security and privacy concerns of performing an evaluation and management service by telephone and the availability of in person appointments. I also discussed with the patient that there may be a patient responsible charge related to this service. The patient expressed understanding and agreed to proceed.     I discussed the assessment and treatment plan with the patient. The patient was provided an opportunity to ask questions and all were answered. The patient agreed with the plan and demonstrated an understanding of the instructions.   The patient was advised to call back or seek an in-person evaluation if the symptoms worsen or if the condition fails to improve as anticipated.  I provided 15 minutes of non-face-to-face time during this encounter.   Manuel Ruder, MD  [facility] MD/PA/NP OP Progress Note  01/24/2019 10:01 AM LEMON STERNBERG  MRN:  [id number]  Chief Complaint:  Chief Complaint    Depression; Anxiety; Follow-up     HPI: this patient is a 48 year-old divorced white male who lives alone in [place]. He has a 55 year old son. He is on disability  The patient was referred by [facility] for further evaluation of anxiety.  The patient states that he began having issues with depression and anxiety as a younger child. At age 48 he got very depressed after his grandmother died. He was seen at the [facility]. At age 48 he was hospitalized at [facility] after he attempted suicide over breakup with a girlfriend. He continued followup on and off at the mental Health [facility]. Unfortunately he got heavily into drinking and using drugs primarily cocaine. He was arrested for cocaine possession. He was later charged with  possession of a firearm while being a convicted felon and driving without a license and spends some time in the state prison. In the mid 90s he had to go to a rehabilitation [facility] to get off alcohol. He claims that he quit using drugs about 8 years ago.  The patient has had several other times in jail most recently over the summer. These are all related to driving without a license.  Over the years the patient has had several psychiatric diagnoses. He saw Dr. Karn Cassis at [facility] who diagnosed him with ADHD, possible bipolar disorder and generalized anxiety disorder. He admits he was a hyperactive child and couldn't focus and ended up quitting in the ninth grade after repeating the seventh grade twice. He's never been on medication for ADHD. He claims he's had the best response to combination of Xanax and Remeron.  The patient states that over the summer his medications were stopped while he was in jail. Since then he's been extremely anxious and shakes all the time. His stomach is in knots and he has constant diarrhea. He is unable to sleep. He's been crying a lot and very frustrated. 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 He's had recurrent panic attacks and severe social anxiety. He admits to passive suicidal ideation but with no specific plan and no homicidal ideation. He denies any psychotic symptoms. He has few activities that spends all this time with family and his girlfriend. He also admits to chronic pain in his back from degenerative  disc disease and past accident  The patient returns for follow-up after 4 months.  We have missed some appointments.  He states that he is doing better.  Last time he was very sad about losing both his mother and his brother in the same year.  He was having a lot of trouble sleeping and feeling a lot of anxiety.  I did increase his Xanax and he is starting to do better.  He states that he had a good Thanksgiving with  his son and granddaughter and he plans to do the same at Christmas.  He is staying busy and active cutting down trees for firewood.  He is much less depressed and he is sleeping better. Visit Diagnosis:    ICD-10-CM   1. Generalized anxiety disorder  F41.1   2. Attention deficit hyperactivity disorder (ADHD), predominantly hyperactive type  F90.1     Past Psychiatric History: Past outpatient treatment  Past Medical History:  Past Medical History:  Diagnosis Date  . Anxiety   . Bulging lumbar disc   . Degenerative arthritis   . GERD (gastroesophageal reflux disease)   . Hypoglycemia     Past Surgical History:  Procedure Laterality Date  . CLAVICLE SURGERY    . HAND SURGERY      Family Psychiatric History: see below  Family History:  Family History  Problem Relation Age of Onset  . Anxiety disorder Mother   . Alcohol abuse Father   . Anxiety disorder Father   . Depression Father   . ADD / ADHD Son     Social History:  Social History   Socioeconomic History  . Marital status: Divorced    Spouse name: Not on file  . Number of children: Not on file  . Years of education: Not on file  . Highest education level: Not on file  Occupational History  . Not on file  Social Needs  . Financial resource strain: Not on file  . Food insecurity    Worry: Not on file    Inability: Not on file  . Transportation needs    Medical: Not on file    Non-medical: Not on file  Tobacco Use  . Smoking status: Current Every Day Smoker    Packs/day: 1.00    Years: 30.00    Pack years: 30.00  . Smokeless tobacco: Never Used  Substance and Sexual Activity  . Alcohol use: No  . Drug use: No    Comment: per pt he stopped Cocaine 5-6 years ago and did use Marijuana Christmas of 2014  . Sexual activity: Yes    Partners: Female    Birth control/protection: Condom  Lifestyle  . Physical activity    Days per week: Not on file    Minutes per session: Not on file  . Stress: Not on file   Relationships  . Social Musicianconnections    Talks on phone: Not on file    Gets together: Not on file    Attends religious service: Not on file    Active member of club or organization: Not on file    Attends meetings of clubs or organizations: Not on file    Relationship status: Not on file  Other Topics Concern  . Not on file  Social History Narrative  . Not on file    Allergies:  Allergies  Allergen Reactions  . Other     IV Dye, Throat swells up and rash  . Talwin [Pentazocine]     Tongue  swell up    Metabolic Disorder Labs: No results found for: HGBA1C, MPG No results found for: PROLACTIN No results found for: CHOL, TRIG, HDL, CHOLHDL, VLDL, LDLCALC No results found for: TSH  Therapeutic Level Labs: No results found for: LITHIUM No results found for: VALPROATE No components found for:  CBMZ  Current Medications: Current Outpatient Medications  Medication Sig Dispense Refill  . ALPRAZolam (XANAX) 1 MG tablet Take one three times a day and two at bedtime 150 tablet 2  . buPROPion (WELLBUTRIN XL) 150 MG 24 hr tablet Take 1 tablet (150 mg total) by mouth every morning. 30 tablet 2  . fluticasone (FLONASE) 50 MCG/ACT nasal spray Place 2 sprays into both nostrils 4 (four) times daily.    Marland Kitchen lovastatin (MEVACOR) 40 MG tablet Take 40 mg by mouth daily.    . methylphenidate (RITALIN) 20 MG tablet Take 1 tablet (20 mg total) by mouth 3 (three) times daily. 90 tablet 0  . methylphenidate (RITALIN) 20 MG tablet Take 1 tablet (20 mg total) by mouth 3 (three) times daily with meals. 90 tablet 0  . methylphenidate (RITALIN) 20 MG tablet Take 1 tablet (20 mg total) by mouth 3 (three) times daily with meals. 90 tablet 0  . NON FORMULARY ?Cotton Liver Capsules? (Mult.) BID    . omeprazole (PRILOSEC) 40 MG capsule Take 40 mg by mouth daily.     Marland Kitchen PROAIR HFA 108 (90 BASE) MCG/ACT inhaler Inhale into the lungs as needed. Reported on 04/29/2015     No current facility-administered  medications for this visit.      Musculoskeletal: Strength & Muscle Tone: within normal limits Gait & Station: normal Patient leans: N/A  Psychiatric Specialty Exam: Review of Systems  All other systems reviewed and are negative.   There were no vitals taken for this visit.There is no height or weight on file to calculate BMI.  General Appearance: NA  Eye Contact:  NA  Speech:  Clear and Coherent  Volume:  Normal  Mood:  Euthymic  Affect:  NA  Thought Process:  Goal Directed  Orientation:  Full (Time, Place, and Person)  Thought Content: WDL   Suicidal Thoughts:  No  Homicidal Thoughts:  No  Memory:  Immediate;   Good Recent;   Good Remote;   Fair  Judgement:  Good  Insight:  Fair  Psychomotor Activity:  Normal  Concentration:  Concentration: Good and Attention Span: Good  Recall:  Good  Fund of Knowledge: Fair  Language: Good  Akathisia:  No  Handed:  Right  AIMS (if indicated): not done  Assets:  Communication Skills Desire for Improvement Physical Health Resilience Social Support Talents/Skills  ADL's:  Intact  Cognition: WNL  Sleep:  Good   Screenings:   Assessment and Plan: This patient is a 48 year old male with a history of depression anxiety and ADHD.  He seems to be doing better with the increased Xanax.  He denies current depression suicidal ideation and he is sleeping better.  His focus remains good on his ADHD medication.  He will continue Xanax 1 mg 3 times daily and 2 mg at bedtime to help with sleep, Wellbutrin XL 150 mg daily for depression and methylphenidate 20 mg 3 times daily for ADHD.  He will return to see me in 3 months   Levonne Spiller, MD 01/24/2019, 10:01 AM

## 2019-02-20 ENCOUNTER — Telehealth (HOSPITAL_COMMUNITY): Payer: Self-pay

## 2019-02-20 ENCOUNTER — Other Ambulatory Visit (HOSPITAL_COMMUNITY): Payer: Self-pay | Admitting: Psychiatry

## 2019-02-20 MED ORDER — ALPRAZOLAM 1 MG PO TABS: ORAL_TABLET | ORAL | 2 refills | Status: DC

## 2019-02-20 NOTE — Telephone Encounter (Signed)
Medication management - Telephone call with [place] and then with patient after he left several messages he could not get his Xanax refilled. Ovid Curd, pharmacist there reported they have a restricted supply of Xanax each month now due to [facility] changes and will not have more in to fill pt's prescription until the coming week.  Stated pt last filled on 01/23/19.  Discussed with Dr. Harrington Challenger who sent a new order to the [facility] in [place] and informed patient of this.  Informed patient he could fill the medication on 02/23/19 due to last fill date and patient to call back if any further problems getting his medications filled.

## 2019-02-21 ENCOUNTER — Telehealth (HOSPITAL_COMMUNITY): Payer: Self-pay

## 2019-02-21 NOTE — Telephone Encounter (Signed)
Medication management - Message left for pt after he left one that he had checked with the [facility] and they did not have a new Alprazolam order for him.  Informed patient on message this nurse spoke with Marcie Bal, a pharmacist at the [facility] this morning and that they do have his order Dr. Harrington Challenger had e-scribed in on 02/19/19, that they do have Alprazolam medication in-house and would be able to fill his prescription when due on 02/23/19.  Requested patient call back if any other issues.

## 2019-03-28 ENCOUNTER — Other Ambulatory Visit (HOSPITAL_COMMUNITY): Payer: Self-pay | Admitting: Psychiatry

## 2019-03-28 ENCOUNTER — Telehealth (HOSPITAL_COMMUNITY): Payer: Self-pay | Admitting: *Deleted

## 2019-03-28 MED ORDER — METHYLPHENIDATE HCL 20 MG PO TABS: 20.0000 mg | ORAL_TABLET | Freq: Three times a day (TID) | ORAL | 0 refills | Status: DC

## 2019-03-28 MED ORDER — BUPROPION HCL ER (XL) 150 MG PO TB24: 150.0000 mg | ORAL_TABLET | ORAL | 2 refills | Status: DC

## 2019-03-28 MED ORDER — ALPRAZOLAM 1 MG PO TABS: ORAL_TABLET | ORAL | 2 refills | Status: DC

## 2019-03-28 NOTE — Telephone Encounter (Signed)
sent 

## 2019-03-28 NOTE — Telephone Encounter (Signed)
Patient &  The old Rx [facility]  Called stating that Patient's new insurance isn't accepted @ [facility] & that his med's are to be sent to [facility] [address] [place] . Preferred Rx  Has been updated in EPIC Please send send scripts for Ritalin to new Rx

## 2019-04-05 ENCOUNTER — Telehealth (HOSPITAL_COMMUNITY): Payer: Self-pay | Admitting: *Deleted

## 2019-04-05 NOTE — Telephone Encounter (Signed)
[place] BC/BS  [id number]  ALPRAZolam (XANAX) 1 MG tablet-- QTY 120  NO PRIOR AUTHORIZATION IS NEEDED

## 2019-04-08 ENCOUNTER — Telehealth (HOSPITAL_COMMUNITY): Payer: Self-pay | Admitting: *Deleted

## 2019-04-08 NOTE — Telephone Encounter (Signed)
[facility] DEPT # [phone]  FAXED REMARKS THIS MEDICATION methylphenidate (RITALIN) 20 MG tablet  DOES NOT REQUIRE PRIOR AUTHORIZATION @ THIS TIME.  RAN TEST CLAIM OUT & NOT GETTING ANY REJECTIONS.

## 2019-04-23 ENCOUNTER — Other Ambulatory Visit (HOSPITAL_COMMUNITY): Payer: Self-pay | Admitting: Psychiatry

## 2019-04-23 ENCOUNTER — Telehealth (HOSPITAL_COMMUNITY): Payer: Self-pay | Admitting: *Deleted

## 2019-04-23 MED ORDER — ALPRAZOLAM 1 MG PO TABS: 1.0000 mg | ORAL_TABLET | Freq: Four times a day (QID) | ORAL | 2 refills | Status: DC

## 2019-04-23 NOTE — Telephone Encounter (Signed)
Done  Rx also notified

## 2019-04-23 NOTE — Telephone Encounter (Signed)
I reduced the quantity to 120, please let pt know

## 2019-04-23 NOTE — Telephone Encounter (Signed)
ANGIE @ [facility] HAS SCRIPT FOR :  ALPRAZolam (XANAX) 1 MG tablet 150 tablet    ** 04/05/2019: [place] [employer]/BS  #[phone]  QTY LIMIT ALPRAZolam (XANAX) 1 MG tablet-- QTY 120  NO PRIOR AUTHORIZATION IS NEEDED

## 2019-04-29 ENCOUNTER — Ambulatory Visit (HOSPITAL_COMMUNITY): Payer: Medicare PPO | Admitting: Psychiatry

## 2019-05-06 ENCOUNTER — Other Ambulatory Visit: Payer: Self-pay

## 2019-05-06 ENCOUNTER — Ambulatory Visit (INDEPENDENT_AMBULATORY_CARE_PROVIDER_SITE_OTHER): Payer: Self-pay | Admitting: Psychiatry

## 2019-05-06 ENCOUNTER — Encounter (HOSPITAL_COMMUNITY): Payer: Self-pay | Admitting: Psychiatry

## 2019-05-06 DIAGNOSIS — F901 Attention-deficit hyperactivity disorder, predominantly hyperactive type: Secondary | ICD-10-CM

## 2019-05-06 DIAGNOSIS — F411 Generalized anxiety disorder: Secondary | ICD-10-CM

## 2019-05-06 MED ORDER — BUPROPION HCL ER (XL) 150 MG PO TB24: 150.0000 mg | ORAL_TABLET | ORAL | 2 refills | Status: DC

## 2019-05-06 MED ORDER — ALPRAZOLAM 1 MG PO TABS: 1.0000 mg | ORAL_TABLET | Freq: Four times a day (QID) | ORAL | 2 refills | Status: DC

## 2019-05-06 MED ORDER — TEMAZEPAM 30 MG PO CAPS: 30.0000 mg | ORAL_CAPSULE | Freq: Every evening | ORAL | 2 refills | Status: DC | PRN

## 2019-05-06 MED ORDER — METHYLPHENIDATE HCL 20 MG PO TABS: 20.0000 mg | ORAL_TABLET | Freq: Three times a day (TID) | ORAL | 0 refills | Status: DC

## 2019-05-06 NOTE — Progress Notes (Signed)
Virtual Visit via Telephone Note  I connected with Manuel [facility] on 05/06/19 at  9:40 AM EDT by telephone and verified that I am speaking with the correct person using two identifiers.   I discussed the limitations, risks, security and privacy concerns of performing an evaluation and management service by telephone and the availability of in person appointments. I also discussed with the patient that there may be a patient responsible charge related to this service. The patient expressed understanding and agreed to proceed.     I discussed the assessment and treatment plan with the patient. The patient was provided an opportunity to ask questions and all were answered. The patient agreed with the plan and demonstrated an understanding of the instructions.   The patient was advised to call back or seek an in-person evaluation if the symptoms worsen or if the condition fails to improve as anticipated.  I provided 15 minutes of non-face-to-face time during this encounter.   [name], MD  [facility] MD/PA/NP OP Progress Note  05/06/2019 10:01 AM Manuel [facility]  MRN:  [id number]  Chief Complaint:  Chief Complaint    Anxiety; ADHD; Follow-up; Depression     HPI: this patient is a 49year-old divorced white male who lives alone in [place]. He has a 13 year old son. He is on disability  The patient was referred by [facility] family medicine for further evaluation of anxiety.  The patient states that he began having issues with depression and anxiety as a younger child. At age 29 he got very depressed after his grandmother died. He was seen at the [facility]. At age 47 he was hospitalized at [facility] after he attempted suicide over breakup with a girlfriend. He continued followup on and off at the mental Health Center. Unfortunately he got heavily into drinking and using drugs primarily cocaine. He was arrested for cocaine possession. He was later charged  with possession of a firearm while being a convicted felon and driving without a license and spends some time in the state prison. In the mid 90s he had to go to a rehabilitation center to get off alcohol. He claims that he quit using drugs about 8 years ago.  The patient has had several other times in jail most recently over the summer. These are all related to driving without a license.  Over the years the patient has had several psychiatric diagnoses. He saw Dr. Karn Cassis at [facility] who diagnosed him with ADHD, possible bipolar disorder and generalized anxiety disorder. He admits he was a hyperactive child and couldn't focus and ended up quitting in the ninth grade after repeating the seventh grade twice. He's never been on medication for ADHD. He claims he's had the best response to combination of Xanax and Remeron.  The patient states that over the summer his medications were stopped while he was in jail. Since then he's been extremely anxious and shakes all the time. His stomach is in knots and he has constant diarrhea. He is unable to sleep. He's been crying a lot and very frustrated. 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 He's had recurrent panic attacks and severe social anxiety. He admits to passive suicidal ideation but with no specific plan and no homicidal ideation. He denies any psychotic symptoms. He has few activities that spends all this time with family and his girlfriend. He also admits to chronic pain in his back from degenerative  disc disease and past accident  The patient returns for follow-up after 3 months.  For the most part he states he has been doing okay.  We had to go back to his Xanax to 4 a day due to insurance reasons and I also was not comfortable prescribing 5 a day.  He states his anxiety is still under fairly good control.  He denies being depressed or suicidal.  He is not sleeping well however and asked to go back on  temazepam.  He is staying busy around his property and taking care of his granddaughter.  He is staying well focused on the methylphenidate Visit Diagnosis:    ICD-10-CM   1. Generalized anxiety disorder  F41.1   2. Attention deficit hyperactivity disorder (ADHD), predominantly hyperactive type  F90.1     Past Psychiatric History: Past outpatient treatment  Past Medical History:  Past Medical History:  Diagnosis Date  . Anxiety   . Bulging lumbar disc   . Degenerative arthritis   . GERD (gastroesophageal reflux disease)   . Hypoglycemia     Past Surgical History:  Procedure Laterality Date  . CLAVICLE SURGERY    . HAND SURGERY      Family Psychiatric History: see below  Family History:  Family History  Problem Relation Age of Onset  . Anxiety disorder Mother   . Alcohol abuse Father   . Anxiety disorder Father   . Depression Father   . ADD / ADHD Son     Social History:  Social History   Socioeconomic History  . Marital status: Divorced    Spouse name: Not on file  . Number of children: Not on file  . Years of education: Not on file  . Highest education level: Not on file  Occupational History  . Not on file  Tobacco Use  . Smoking status: Current Every Day Smoker    Packs/day: 1.00    Years: 30.00    Pack years: 30.00  . Smokeless tobacco: Never Used  Substance and Sexual Activity  . Alcohol use: No  . Drug use: No    Comment: per pt he stopped Cocaine 5-6 years ago and did use Marijuana Christmas of 2014  . Sexual activity: Yes    Partners: Female    Birth control/protection: Condom  Other Topics Concern  . Not on file  Social History Narrative  . Not on file   Social Determinants of Health   Financial Resource Strain:   . Difficulty of Paying Living Expenses:   Food Insecurity:   . Worried About Charity fundraiser in the Last Year:   . Arboriculturist in the Last Year:   Transportation Needs:   . Film/video editor (Medical):   Marland Kitchen Lack  of Transportation (Non-Medical):   Physical Activity:   . Days of Exercise per Week:   . Minutes of Exercise per Session:   Stress:   . Feeling of Stress :   Social Connections:   . Frequency of Communication with Friends and Family:   . Frequency of Social Gatherings with Friends and Family:   . Attends Religious Services:   . Active Member of Clubs or Organizations:   . Attends Archivist Meetings:   Marland Kitchen Marital Status:     Allergies:  Allergies  Allergen Reactions  . Other     IV Dye, Throat swells up and rash  . Talwin [Pentazocine]     Tongue swell up  Metabolic Disorder Labs: No results found for: HGBA1C, MPG No results found for: PROLACTIN No results found for: CHOL, TRIG, HDL, CHOLHDL, VLDL, LDLCALC No results found for: TSH  Therapeutic Level Labs: No results found for: LITHIUM No results found for: VALPROATE No components found for:  CBMZ  Current Medications: Current Outpatient Medications  Medication Sig Dispense Refill  . ALPRAZolam (XANAX) 1 MG tablet Take 1 tablet (1 mg total) by mouth in the morning, at noon, in the evening, and at bedtime. 120 tablet 2  . buPROPion (WELLBUTRIN XL) 150 MG 24 hr tablet Take 1 tablet (150 mg total) by mouth every morning. 30 tablet 2  . fluticasone (FLONASE) 50 MCG/ACT nasal spray Place 2 sprays into both nostrils 4 (four) times daily.    Marland Kitchen lovastatin (MEVACOR) 40 MG tablet Take 40 mg by mouth daily.    . methylphenidate (RITALIN) 20 MG tablet Take 1 tablet (20 mg total) by mouth 3 (three) times daily. 90 tablet 0  . methylphenidate (RITALIN) 20 MG tablet Take 1 tablet (20 mg total) by mouth 3 (three) times daily with meals. 90 tablet 0  . methylphenidate (RITALIN) 20 MG tablet Take 1 tablet (20 mg total) by mouth 3 (three) times daily with meals. 90 tablet 0  . NON FORMULARY ?Cotton Liver Capsules? (Mult.) BID    . omeprazole (PRILOSEC) 40 MG capsule Take 40 mg by mouth daily.     Marland Kitchen PROAIR HFA 108 (90 BASE)  MCG/ACT inhaler Inhale into the lungs as needed. Reported on 04/29/2015    . temazepam (RESTORIL) 30 MG capsule Take 1 capsule (30 mg total) by mouth at bedtime as needed for sleep. 30 capsule 2   No current facility-administered medications for this visit.     Musculoskeletal: Strength & Muscle Tone: within normal limits Gait & Station: normal Patient leans: N/A  Psychiatric Specialty Exam: Review of Systems  Psychiatric/Behavioral: Positive for sleep disturbance.  All other systems reviewed and are negative.   There were no vitals taken for this visit.There is no height or weight on file to calculate BMI.  General Appearance: NA  Eye Contact:  NA  Speech:  Clear and Coherent  Volume:  Normal  Mood:  Euthymic  Affect:  NA  Thought Process:  Goal Directed  Orientation:  Full (Time, Place, and Person)  Thought Content: WDL   Suicidal Thoughts:  No  Homicidal Thoughts:  No  Memory:  Immediate;   Good Recent;   Good Remote;   Good  Judgement:  Good  Insight:  Fair  Psychomotor Activity:  Normal  Concentration:  Concentration: Good and Attention Span: Good  Recall:  Good  Fund of Knowledge: Good  Language: Good  Akathisia:  No  Handed:  Right  AIMS (if indicated): not done  Assets:  Communication Skills Desire for Improvement Physical Health Resilience Social Support Talents/Skills  ADL's:  Intact  Cognition: WNL  Sleep:  Poor   Screenings:   Assessment and Plan: This patient is a 49 year old male with a history of depression anxiety and ADHD.  He seems to be managing okay with the reduced amount of Xanax and I think overall this is better for his health.  He will continue Xanax 1 mg 4 times daily.  He will also continue Wellbutrin XL 150 mg daily for depression and methylphenidate 20 mg 3 times daily for ADHD.  We will add back temazepam 30 mg at bedtime to help with sleep.  He will return to see me in  3 months   [name], MD 05/06/2019, 10:01 AM

## 2019-08-24 ENCOUNTER — Other Ambulatory Visit (HOSPITAL_COMMUNITY): Payer: Self-pay | Admitting: Psychiatry

## 2019-08-27 NOTE — Telephone Encounter (Signed)
Call for appt

## 2019-08-27 NOTE — Telephone Encounter (Signed)
LMOM

## 2019-09-17 NOTE — Telephone Encounter (Signed)
Voicemail box is full. 

## 2019-09-19 ENCOUNTER — Ambulatory Visit (INDEPENDENT_AMBULATORY_CARE_PROVIDER_SITE_OTHER): Payer: Self-pay | Admitting: Psychiatry

## 2019-09-19 ENCOUNTER — Other Ambulatory Visit: Payer: Self-pay

## 2019-09-19 ENCOUNTER — Encounter (HOSPITAL_COMMUNITY): Payer: Self-pay | Admitting: Psychiatry

## 2019-09-19 DIAGNOSIS — F902 Attention-deficit hyperactivity disorder, combined type: Secondary | ICD-10-CM

## 2019-09-19 DIAGNOSIS — F411 Generalized anxiety disorder: Secondary | ICD-10-CM

## 2019-09-19 DIAGNOSIS — F901 Attention-deficit hyperactivity disorder, predominantly hyperactive type: Secondary | ICD-10-CM

## 2019-09-19 MED ORDER — TEMAZEPAM 30 MG PO CAPS: 30.0000 mg | ORAL_CAPSULE | Freq: Every evening | ORAL | 2 refills | Status: DC | PRN

## 2019-09-19 MED ORDER — METHYLPHENIDATE HCL 20 MG PO TABS: 20.0000 mg | ORAL_TABLET | Freq: Three times a day (TID) | ORAL | 0 refills | Status: DC

## 2019-09-19 MED ORDER — BUPROPION HCL ER (XL) 150 MG PO TB24: 150.0000 mg | ORAL_TABLET | ORAL | 2 refills | Status: DC

## 2019-09-19 MED ORDER — ALPRAZOLAM 1 MG PO TABS: ORAL_TABLET | ORAL | 2 refills | Status: DC

## 2019-09-19 NOTE — Progress Notes (Signed)
Virtual Visit via Telephone Note  I connected with Manuel Kelly on 09/19/19 at  2:30 PM EDT by telephone and verified that I am speaking with the correct person using two identifiers.   I discussed the limitations, risks, security and privacy concerns of performing an evaluation and management service by telephone and the availability of in person appointments. I also discussed with the patient that there may be a patient responsible charge related to this service. The patient expressed understanding and agreed to proceed.     I discussed the assessment and treatment plan with the patient. The patient was provided an opportunity to ask questions and all were answered. The patient agreed with the plan and demonstrated an understanding of the instructions.   The patient was advised to call back or seek an in-person evaluation if the symptoms worsen or if the condition fails to improve as anticipated.  I provided 15 minutes of non-face-to-face time during this encounter. Location: Provider office, patient home  Manuel Ruder, MD  [facility] MD/PA/NP OP Progress Note  09/19/2019 2:42 PM Manuel Kelly  MRN:  [id number]  Chief Complaint:  Chief Complaint    Depression; Anxiety; ADHD     HPI: this patient is a 49year-old divorced white male who lives alone in [place]. He has a  son. He is on disability  The patient was referred by [facility] family medicine for further evaluation of anxiety.  The patient states that he began having issues with depression and anxiety as a younger child. At age 49 he got very depressed after his grandmother died. He was seen at the [facility]. At age 49 he was hospitalized at [facility] after he attempted suicide over breakup with a girlfriend. He continued followup on and off at the mental Health Center. Unfortunately he got heavily into drinking and using drugs primarily cocaine. He was arrested for cocaine possession. He  was later charged with possession of a firearm while being a convicted felon and driving without a license and spends some time in the state prison. In the mid 90s he had to go to a rehabilitation center to get off alcohol. He claims that he quit using drugs about 8 years ago.  The patient has had several other times in jail most recently over the summer. These are all related to driving without a license.  Over the years the patient has had several psychiatric diagnoses. He saw Dr. Karn Cassis at [facility] who diagnosed him with ADHD, possible bipolar disorder and generalized anxiety disorder. He admits he was a hyperactive child and couldn't focus and ended up quitting in the ninth grade after repeating the seventh grade twice. He's never been on medication for ADHD. He claims he's had the best response to combination of Xanax and Remeron.  The patient states that over the summer his medications were stopped while he was in jail. Since then he's been extremely anxious and shakes all the time. His stomach is in knots and he has constant diarrhea. He is unable to sleep. He's been crying a lot and very frustrated. 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 He's had recurrent panic attacks and severe social anxiety. He admits to passive suicidal ideation but with no specific plan and no homicidal ideation. He denies any psychotic symptoms. He has few activities that spends all this time with family and his girlfriend. He also admits to chronic pain in his  back from degenerative disc disease and past accident  The patient returns for follow-up after 4 months.  He states overall he is doing pretty well.  He is spending most of his time fixing up his deceased mother's house so that his family can move them.  His mood has been good by his report.  Last time we added temazepam to help him sleep and it is working well.  He states the Xanax continues to help his anxiety and  his ADHD symptoms are well controlled with methylphenidate.  He denies serious depression suicidal ideation or thoughts of self-harm.  He denies significant symptoms of anxiety. Visit Diagnosis:    ICD-10-CM   1. Generalized anxiety disorder  F41.1   2. Attention deficit hyperactivity disorder (ADHD), predominantly hyperactive type  F90.1   3. Attention deficit hyperactivity disorder (ADHD), combined type  F90.2     Past Psychiatric History: Past outpatient treatment  Past Medical History:  Past Medical History:  Diagnosis Date  . Anxiety   . Bulging lumbar disc   . Degenerative arthritis   . GERD (gastroesophageal reflux disease)   . Hypoglycemia     Past Surgical History:  Procedure Laterality Date  . CLAVICLE SURGERY    . HAND SURGERY      Family Psychiatric History: see below  Family History:  Family History  Problem Relation Age of Onset  . Anxiety disorder Mother   . Alcohol abuse Father   . Anxiety disorder Father   . Depression Father   . ADD / ADHD Son     Social History:  Social History   Socioeconomic History  . Marital status: Divorced    Spouse name: Not on file  . Number of children: Not on file  . Years of education: Not on file  . Highest education level: Not on file  Occupational History  . Not on file  Tobacco Use  . Smoking status: Current Every Day Smoker    Packs/day: 1.00    Years: 30.00    Pack years: 30.00  . Smokeless tobacco: Never Used  Substance and Sexual Activity  . Alcohol use: No  . Drug use: No    Comment: per pt he stopped Cocaine 5-6 years ago and did use Marijuana Christmas of 2014  . Sexual activity: Yes    Partners: Female    Birth control/protection: Condom  Other Topics Concern  . Not on file  Social History Narrative  . Not on file   Social Determinants of Health   Financial Resource Strain:   . Difficulty of Paying Living Expenses:   Food Insecurity:   . Worried About Programme researcher, broadcasting/film/video in the Last Year:    . Barista in the Last Year:   Transportation Needs:   . Freight forwarder (Medical):   Marland Kitchen Lack of Transportation (Non-Medical):   Physical Activity:   . Days of Exercise per Week:   . Minutes of Exercise per Session:   Stress:   . Feeling of Stress :   Social Connections:   . Frequency of Communication with Friends and Family:   . Frequency of Social Gatherings with Friends and Family:   . Attends Religious Services:   . Active Member of Clubs or Organizations:   . Attends Banker Meetings:   Marland Kitchen Marital Status:     Allergies:  Allergies  Allergen Reactions  . Other     IV Dye, Throat swells up and rash  .  Talwin [Pentazocine]     Tongue swell up    Metabolic Disorder Labs: No results found for: HGBA1C, MPG No results found for: PROLACTIN No results found for: CHOL, TRIG, HDL, CHOLHDL, VLDL, LDLCALC No results found for: TSH  Therapeutic Level Labs: No results found for: LITHIUM No results found for: VALPROATE No components found for:  CBMZ  Current Medications: Current Outpatient Medications  Medication Sig Dispense Refill  . ALPRAZolam (XANAX) 1 MG tablet TAKE ONE TABLET BY MOUTH IN THE MORNING, AT NOON, IN THE EVENING, AND AT BEDTIME 120 tablet 2  . buPROPion (WELLBUTRIN XL) 150 MG 24 hr tablet Take 1 tablet (150 mg total) by mouth every morning. 30 tablet 2  . fluticasone (FLONASE) 50 MCG/ACT nasal spray Place 2 sprays into both nostrils 4 (four) times daily.    Marland Kitchen lovastatin (MEVACOR) 40 MG tablet Take 40 mg by mouth daily.    . methylphenidate (RITALIN) 20 MG tablet Take 1 tablet (20 mg total) by mouth 3 (three) times daily. 90 tablet 0  . methylphenidate (RITALIN) 20 MG tablet Take 1 tablet (20 mg total) by mouth 3 (three) times daily with meals. 90 tablet 0  . methylphenidate (RITALIN) 20 MG tablet Take 1 tablet (20 mg total) by mouth 3 (three) times daily with meals. 90 tablet 0  . NON FORMULARY ?Cotton Liver Capsules? (Mult.) BID     . omeprazole (PRILOSEC) 40 MG capsule Take 40 mg by mouth daily.     Marland Kitchen PROAIR HFA 108 (90 BASE) MCG/ACT inhaler Inhale into the lungs as needed. Reported on 04/29/2015    . temazepam (RESTORIL) 30 MG capsule Take 1 capsule (30 mg total) by mouth at bedtime as needed for sleep. 30 capsule 2   No current facility-administered medications for this visit.     Musculoskeletal: Strength & Muscle Tone: within normal limits Gait & Station: normal Patient leans: N/A  Psychiatric Specialty Exam: Review of Systems  Musculoskeletal: Positive for back pain.  All other systems reviewed and are negative.   There were no vitals taken for this visit.There is no height or weight on file to calculate BMI.  General Appearance: NA  Eye Contact:  NA  Speech:  Clear and Coherent  Volume:  Normal  Mood:  Euthymic  Affect:  NA  Thought Process:  Goal Directed  Orientation:  Full (Time, Place, and Person)  Thought Content: WDL   Suicidal Thoughts:  No  Homicidal Thoughts:  No  Memory:  Immediate;   Good Recent;   Good Remote;   Good  Judgement:  Good  Insight:  Fair  Psychomotor Activity:  Normal  Concentration:  Concentration: Good and Attention Span: Good  Recall:  Good  Fund of Knowledge: Fair  Language: Good  Akathisia:  No  Handed:  Right  AIMS (if indicated): not done  Assets:  Communication Skills Desire for Improvement Physical Health Resilience Social Support Talents/Skills  ADL's:  Intact  Cognition: WNL  Sleep:  Good   Screenings:   Assessment and Plan: This patient is a 49 year old male with a history of depression anxiety and ADHD.  He continues to do well on his current regimen.  He will continue Xanax 1 mg 4 times daily for anxiety, Wellbutrin XL 150 mg daily for depression, methylphenidate 20 mg 3 times daily for ADHD and temazepam 30 mg at bedtime to help with sleep.  He will return to see me in 3 months   Manuel Ruder, MD 09/19/2019, 2:42 PM

## 2019-12-20 ENCOUNTER — Other Ambulatory Visit: Payer: Self-pay

## 2019-12-20 ENCOUNTER — Telehealth (HOSPITAL_COMMUNITY): Payer: Medicare Other | Admitting: Psychiatry

## 2020-01-27 ENCOUNTER — Telehealth (HOSPITAL_COMMUNITY): Payer: Self-pay | Admitting: *Deleted

## 2020-01-27 NOTE — Telephone Encounter (Signed)
Patient called and [facility] stating he is needing refills for his medications. Pt did not leave the names of the medications on the voicemail. Staff called patient to get more details and was not able to reach patient. Per message, pt voicemail box is full and can not accept any messages at this time.

## 2020-01-29 ENCOUNTER — Telehealth (HOSPITAL_COMMUNITY): Payer: Medicare Other | Admitting: Psychiatry

## 2020-01-29 ENCOUNTER — Other Ambulatory Visit: Payer: Self-pay

## 2020-01-30 ENCOUNTER — Other Ambulatory Visit: Payer: Self-pay

## 2020-01-30 ENCOUNTER — Encounter (HOSPITAL_COMMUNITY): Payer: Medicare Other | Admitting: Psychiatry

## 2020-01-30 MED ORDER — ALPRAZOLAM 1 MG PO TABS: ORAL_TABLET | ORAL | 0 refills | Status: DC

## 2020-01-30 MED ORDER — TEMAZEPAM 30 MG PO CAPS: 30.0000 mg | ORAL_CAPSULE | Freq: Every evening | ORAL | 0 refills | Status: DC | PRN

## 2020-01-30 MED ORDER — BUPROPION HCL ER (XL) 150 MG PO TB24: 150.0000 mg | ORAL_TABLET | ORAL | 0 refills | Status: DC

## 2020-01-30 MED ORDER — METHYLPHENIDATE HCL 20 MG PO TABS: 20.0000 mg | ORAL_TABLET | Freq: Three times a day (TID) | ORAL | 0 refills | Status: DC

## 2020-01-31 ENCOUNTER — Other Ambulatory Visit: Payer: Self-pay

## 2020-01-31 ENCOUNTER — Encounter (HOSPITAL_COMMUNITY): Payer: Self-pay | Admitting: Psychiatry

## 2020-01-31 ENCOUNTER — Telehealth (INDEPENDENT_AMBULATORY_CARE_PROVIDER_SITE_OTHER): Payer: PRIVATE HEALTH INSURANCE | Admitting: Psychiatry

## 2020-01-31 DIAGNOSIS — F901 Attention-deficit hyperactivity disorder, predominantly hyperactive type: Secondary | ICD-10-CM | POA: Diagnosis not present

## 2020-01-31 DIAGNOSIS — F411 Generalized anxiety disorder: Secondary | ICD-10-CM

## 2020-01-31 MED ORDER — ALPRAZOLAM 1 MG PO TABS: ORAL_TABLET | ORAL | 0 refills | Status: DC

## 2020-01-31 MED ORDER — METHYLPHENIDATE HCL 20 MG PO TABS: 20.0000 mg | ORAL_TABLET | Freq: Three times a day (TID) | ORAL | 0 refills | Status: DC

## 2020-01-31 MED ORDER — TEMAZEPAM 30 MG PO CAPS: 30.0000 mg | ORAL_CAPSULE | Freq: Every evening | ORAL | 2 refills | Status: DC | PRN

## 2020-01-31 MED ORDER — BUPROPION HCL ER (XL) 150 MG PO TB24: 150.0000 mg | ORAL_TABLET | ORAL | 2 refills | Status: DC
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
## 2020-03-16 ENCOUNTER — Telehealth (HOSPITAL_COMMUNITY): Payer: Self-pay | Admitting: *Deleted

## 2020-03-16 ENCOUNTER — Other Ambulatory Visit (HOSPITAL_COMMUNITY): Payer: Self-pay | Admitting: Psychiatry

## 2020-03-16 MED ORDER — ALPRAZOLAM 1 MG PO TABS: ORAL_TABLET | ORAL | 2 refills | Status: DC

## 2020-03-16 NOTE — Telephone Encounter (Signed)
Informed patient with what provider stated and patient verbalized understanding.  

## 2020-03-16 NOTE — Telephone Encounter (Signed)
Pt called stating he is needing refills for his Xanax. Pt is aware the e-scribe is down as of right now.

## 2020-03-16 NOTE — Telephone Encounter (Signed)
sent 

## 2020-03-16 NOTE — Telephone Encounter (Signed)
Noted, called number on file and pt voicemail box was full

## 2020-03-20 ENCOUNTER — Telehealth (HOSPITAL_COMMUNITY): Payer: Self-pay | Admitting: *Deleted

## 2020-03-20 ENCOUNTER — Other Ambulatory Visit (HOSPITAL_COMMUNITY): Payer: Self-pay | Admitting: Psychiatry

## 2020-03-20 MED ORDER — ALPRAZOLAM 1 MG PO TABS: ORAL_TABLET | ORAL | 2 refills | Status: DC

## 2020-03-20 NOTE — Telephone Encounter (Signed)
Per pt his Xanax did not go through with the Xanax. It must have been when the system was having issues with sending e-prescriptions. Per pt he would like for provider to please send in his script again and if theres any questions they need to contact Tommy at the pharmacy at [phone]

## 2020-03-20 NOTE — Telephone Encounter (Signed)
Pt informed med was sent to [facility] in [place] and he said he do not use that pharmacy and he uses [facility] in [place].

## 2020-03-20 NOTE — Telephone Encounter (Signed)
Sent to [facility] in [place]

## 2020-03-20 NOTE — Telephone Encounter (Signed)
Ok, sent

## 2020-03-23 NOTE — Telephone Encounter (Signed)
Patient aware.

## 2020-04-27 ENCOUNTER — Telehealth (HOSPITAL_COMMUNITY): Payer: PRIVATE HEALTH INSURANCE | Admitting: Psychiatry

## 2020-04-27 ENCOUNTER — Other Ambulatory Visit: Payer: Self-pay

## 2020-07-16 ENCOUNTER — Telehealth (HOSPITAL_COMMUNITY): Payer: Self-pay | Admitting: *Deleted

## 2020-07-16 NOTE — Telephone Encounter (Signed)
Called received from [facility] pharmacy in [place] stating that they have caught patient trying to fill his Xanax from them and [facility].   Per [facility] pharmacy, pt is known to filling him Xanax script early.   Per [facility] pharmacy, when they ran the PMP database to see if patient have attempted to get his Xanax from another pharmacy because patient came to them today to request refill.   Per [facility] pharmacy whenever he fills his script at both them and [facility], pt is getting 30 days supply.   [facility] stated patient tried filling Xanax today 07-16-2020,got refill on 06-07-2020, 04-15-2020 and 03-16-2020 and have in their system for their staff to keep an eye out when filling patient Controls due to him trying to get it filled at different pharmacies.   [facility] stated that [facility] filled with [facility] with 30 days supply on 07-02-2020, 05-09-2020.  Staff called [facility] and spoke with pharmacist Olegario Messier and informed her with information.   Per Olegario Messier, she confirmed the days that [facility] provided.   Per Olegario Messier the pharmacist at [facility], they had a PRN pharmacist and overlooked the note they had on file about pt filling his script at 2 script and to check the PMP database that's why pt medication was filled early  Per Olegario Messier, they will be discontinuing [facility] for patient Xanax with them and will inform patient that he will have to go to [facility] to get his remaining Xanax scripts.

## 2020-07-30 ENCOUNTER — Other Ambulatory Visit (HOSPITAL_COMMUNITY): Payer: Self-pay | Admitting: Psychiatry

## 2020-07-30 NOTE — Telephone Encounter (Signed)
I finally was able to get onPMP database to verify this. Please schedule him in the next week to discuss

## 2020-07-31 NOTE — Telephone Encounter (Signed)
LMOM

## 2020-08-03 NOTE — Telephone Encounter (Signed)
LMOM

## 2020-08-04 ENCOUNTER — Encounter (HOSPITAL_COMMUNITY): Payer: PRIVATE HEALTH INSURANCE | Admitting: Psychiatry

## 2020-08-04 ENCOUNTER — Other Ambulatory Visit: Payer: Self-pay

## 2020-08-04 ENCOUNTER — Encounter (HOSPITAL_COMMUNITY): Payer: Self-pay | Admitting: Psychiatry

## 2020-08-04 ENCOUNTER — Telehealth (INDEPENDENT_AMBULATORY_CARE_PROVIDER_SITE_OTHER): Payer: PRIVATE HEALTH INSURANCE | Admitting: Psychiatry

## 2020-08-04 ENCOUNTER — Telehealth (HOSPITAL_COMMUNITY): Payer: PRIVATE HEALTH INSURANCE | Admitting: Psychiatry

## 2020-08-04 DIAGNOSIS — F901 Attention-deficit hyperactivity disorder, predominantly hyperactive type: Secondary | ICD-10-CM | POA: Diagnosis not present

## 2020-08-04 DIAGNOSIS — F411 Generalized anxiety disorder: Secondary | ICD-10-CM | POA: Diagnosis not present

## 2020-08-04 MED ORDER — AMPHETAMINE-DEXTROAMPHETAMINE 20 MG PO TABS: 20.0000 mg | ORAL_TABLET | Freq: Every day | ORAL | 0 refills | Status: DC

## 2020-08-04 MED ORDER — BUPROPION HCL ER (XL) 150 MG PO TB24: 150.0000 mg | ORAL_TABLET | ORAL | 2 refills | Status: DC

## 2020-08-04 MED ORDER — ALPRAZOLAM 1 MG PO TABS: ORAL_TABLET | ORAL | 2 refills | Status: DC
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
## 2020-11-03 NOTE — Progress Notes (Signed)
This encounter was created in error - please disregard.

## 2020-11-06 ENCOUNTER — Other Ambulatory Visit: Payer: Self-pay

## 2020-11-06 ENCOUNTER — Telehealth (INDEPENDENT_AMBULATORY_CARE_PROVIDER_SITE_OTHER): Payer: PRIVATE HEALTH INSURANCE | Admitting: Psychiatry

## 2020-11-06 ENCOUNTER — Encounter (HOSPITAL_COMMUNITY): Payer: Self-pay | Admitting: Psychiatry

## 2020-11-06 DIAGNOSIS — F411 Generalized anxiety disorder: Secondary | ICD-10-CM | POA: Diagnosis not present

## 2020-11-06 DIAGNOSIS — F901 Attention-deficit hyperactivity disorder, predominantly hyperactive type: Secondary | ICD-10-CM

## 2020-11-06 MED ORDER — METHYLPHENIDATE HCL 20 MG PO TABS: 20.0000 mg | ORAL_TABLET | Freq: Three times a day (TID) | ORAL | 0 refills | Status: DC

## 2020-11-06 MED ORDER — ALPRAZOLAM 1 MG PO TABS: ORAL_TABLET | ORAL | 2 refills | Status: DC
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
## 2020-11-17 NOTE — Progress Notes (Signed)
This encounter was created in error - please disregard.

## 2021-02-01 ENCOUNTER — Other Ambulatory Visit: Payer: Self-pay

## 2021-02-01 ENCOUNTER — Telehealth (HOSPITAL_COMMUNITY): Payer: PRIVATE HEALTH INSURANCE | Admitting: Psychiatry

## 2021-02-10 ENCOUNTER — Other Ambulatory Visit: Payer: Self-pay

## 2021-02-10 ENCOUNTER — Other Ambulatory Visit (HOSPITAL_COMMUNITY): Payer: Self-pay | Admitting: Psychiatry

## 2021-02-10 ENCOUNTER — Telehealth (HOSPITAL_COMMUNITY): Payer: PRIVATE HEALTH INSURANCE | Admitting: Psychiatry

## 2021-02-10 MED ORDER — METHYLPHENIDATE HCL 20 MG PO TABS: 20.0000 mg | ORAL_TABLET | Freq: Three times a day (TID) | ORAL | 0 refills | Status: DC

## 2021-02-10 MED ORDER — ALPRAZOLAM 1 MG PO TABS: ORAL_TABLET | ORAL | 0 refills | Status: DC

## 2021-02-11 ENCOUNTER — Other Ambulatory Visit (HOSPITAL_COMMUNITY): Payer: Self-pay | Admitting: Psychiatry

## 2021-02-11 ENCOUNTER — Telehealth (HOSPITAL_COMMUNITY): Payer: Self-pay | Admitting: *Deleted

## 2021-02-11 MED ORDER — METHYLPHENIDATE HCL 20 MG PO TABS: 20.0000 mg | ORAL_TABLET | Freq: Three times a day (TID) | ORAL | 0 refills | Status: DC

## 2021-02-11 MED ORDER — ALPRAZOLAM 1 MG PO TABS: ORAL_TABLET | ORAL | 0 refills | Status: DC

## 2021-02-11 NOTE — Telephone Encounter (Signed)
sent 

## 2021-02-11 NOTE — Telephone Encounter (Signed)
Patient stated that he do not use [facility] anymore and that use   [facility] and would like for provider to please send script to [facility] and not [facility].   Message will be sent to provider.   Staff will call [facility] to cancel those script that were sent (Xanax and Ritalin)

## 2021-02-23 ENCOUNTER — Other Ambulatory Visit: Payer: Self-pay

## 2021-02-23 ENCOUNTER — Ambulatory Visit (HOSPITAL_COMMUNITY): Payer: PRIVATE HEALTH INSURANCE | Admitting: Psychiatry

## 2021-02-24 ENCOUNTER — Telehealth (INDEPENDENT_AMBULATORY_CARE_PROVIDER_SITE_OTHER): Payer: 59 | Admitting: Psychiatry

## 2021-02-24 ENCOUNTER — Other Ambulatory Visit: Payer: Self-pay

## 2021-02-24 ENCOUNTER — Encounter (HOSPITAL_COMMUNITY): Payer: Self-pay | Admitting: Psychiatry

## 2021-02-24 DIAGNOSIS — F901 Attention-deficit hyperactivity disorder, predominantly hyperactive type: Secondary | ICD-10-CM

## 2021-02-24 DIAGNOSIS — F411 Generalized anxiety disorder: Secondary | ICD-10-CM | POA: Diagnosis not present

## 2021-02-24 MED ORDER — METHYLPHENIDATE HCL 20 MG PO TABS: 20.0000 mg | ORAL_TABLET | Freq: Three times a day (TID) | ORAL | 0 refills | Status: DC

## 2021-02-24 MED ORDER — ALPRAZOLAM 1 MG PO TABS: ORAL_TABLET | ORAL | 2 refills | Status: DC
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
## 2021-05-10 ENCOUNTER — Telehealth (HOSPITAL_COMMUNITY): Payer: Self-pay

## 2021-05-10 NOTE — Telephone Encounter (Signed)
Medication management - Telephone message left for pt, after he left one 05/07/21 that he was in need of a new Ritalin order.  Spoke to Maralyn Sago, pharmacy tect at [facility] to verify pt picked up his last refill that date and reminded pt on message of need to call our office back to set up his next appointment with Dr. Tenny Craw within the next month.   ?

## 2021-05-26 ENCOUNTER — Telehealth (INDEPENDENT_AMBULATORY_CARE_PROVIDER_SITE_OTHER): Payer: Self-pay | Admitting: Psychiatry

## 2021-05-26 ENCOUNTER — Encounter (HOSPITAL_COMMUNITY): Payer: Self-pay | Admitting: Psychiatry

## 2021-05-26 DIAGNOSIS — F901 Attention-deficit hyperactivity disorder, predominantly hyperactive type: Secondary | ICD-10-CM

## 2021-05-26 DIAGNOSIS — F411 Generalized anxiety disorder: Secondary | ICD-10-CM

## 2021-05-26 MED ORDER — METHYLPHENIDATE HCL 20 MG PO TABS: 20.0000 mg | ORAL_TABLET | Freq: Three times a day (TID) | ORAL | 0 refills | Status: DC

## 2021-05-26 MED ORDER — ALPRAZOLAM 1 MG PO TABS: ORAL_TABLET | ORAL | 2 refills | Status: DC
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
## 2021-09-02 ENCOUNTER — Other Ambulatory Visit (HOSPITAL_COMMUNITY): Payer: Self-pay | Admitting: Psychiatry

## 2021-09-02 NOTE — Telephone Encounter (Signed)
Call for appt

## 2021-10-07 ENCOUNTER — Other Ambulatory Visit (HOSPITAL_COMMUNITY): Payer: Self-pay | Admitting: Psychiatry

## 2021-10-07 ENCOUNTER — Telehealth (HOSPITAL_COMMUNITY): Payer: Self-pay

## 2021-10-07 NOTE — Telephone Encounter (Signed)
Call for appt

## 2021-10-07 NOTE — Telephone Encounter (Signed)
Medication management - message left for patient, after he left one requesting Dr. Tenny Craw send in a new Ritalin order for him, that Dr. Tenny Craw had sent this today to his [facility] and requested pt call back to set up his next appointment

## 2021-11-06 ENCOUNTER — Other Ambulatory Visit (HOSPITAL_COMMUNITY): Payer: Self-pay | Admitting: Psychiatry

## 2021-11-07 NOTE — Telephone Encounter (Signed)
Call for appt

## 2021-11-28 ENCOUNTER — Other Ambulatory Visit (HOSPITAL_COMMUNITY): Payer: Self-pay | Admitting: Psychiatry

## 2021-11-29 NOTE — Telephone Encounter (Signed)
Call for appt

## 2021-12-14 NOTE — Telephone Encounter (Signed)
Called call could not be completed as dailed

## 2021-12-14 NOTE — Telephone Encounter (Signed)
Called, call could not be completed as dailed

## 2022-02-15 ENCOUNTER — Other Ambulatory Visit (HOSPITAL_COMMUNITY): Payer: Self-pay | Admitting: Psychiatry

## 2022-03-03 DIAGNOSIS — R69 Illness, unspecified: Secondary | ICD-10-CM | POA: Diagnosis not present

## 2022-03-18 ENCOUNTER — Telehealth (INDEPENDENT_AMBULATORY_CARE_PROVIDER_SITE_OTHER): Payer: Self-pay | Admitting: Psychiatry

## 2022-03-18 ENCOUNTER — Encounter (HOSPITAL_COMMUNITY): Payer: Self-pay | Admitting: Psychiatry

## 2022-03-18 DIAGNOSIS — F901 Attention-deficit hyperactivity disorder, predominantly hyperactive type: Secondary | ICD-10-CM

## 2022-03-18 DIAGNOSIS — F411 Generalized anxiety disorder: Secondary | ICD-10-CM

## 2022-03-18 MED ORDER — METHYLPHENIDATE HCL 20 MG PO TABS: 20.0000 mg | ORAL_TABLET | Freq: Three times a day (TID) | ORAL | 0 refills | Status: DC

## 2022-03-18 MED ORDER — ALPRAZOLAM 1 MG PO TABS: ORAL_TABLET | ORAL | 2 refills | Status: DC
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
## 2022-05-06 DIAGNOSIS — Z6827 Body mass index (BMI) 27.0-27.9, adult: Secondary | ICD-10-CM | POA: Diagnosis not present

## 2022-05-06 DIAGNOSIS — M25532 Pain in left wrist: Secondary | ICD-10-CM | POA: Diagnosis not present

## 2022-05-13 DIAGNOSIS — S52552A Other extraarticular fracture of lower end of left radius, initial encounter for closed fracture: Secondary | ICD-10-CM | POA: Diagnosis not present

## 2022-05-27 DIAGNOSIS — S52502A Unspecified fracture of the lower end of left radius, initial encounter for closed fracture: Secondary | ICD-10-CM | POA: Diagnosis not present

## 2022-05-27 DIAGNOSIS — M25532 Pain in left wrist: Secondary | ICD-10-CM | POA: Diagnosis not present

## 2022-06-21 ENCOUNTER — Other Ambulatory Visit (HOSPITAL_COMMUNITY): Payer: Self-pay | Admitting: Psychiatry

## 2022-07-03 ENCOUNTER — Other Ambulatory Visit (HOSPITAL_COMMUNITY): Payer: Self-pay | Admitting: Psychiatry

## 2022-08-01 ENCOUNTER — Telehealth (HOSPITAL_COMMUNITY): Payer: Self-pay | Admitting: *Deleted

## 2022-08-01 ENCOUNTER — Other Ambulatory Visit (HOSPITAL_COMMUNITY): Payer: Self-pay | Admitting: Psychiatry

## 2022-08-01 MED ORDER — METHYLPHENIDATE HCL 20 MG PO TABS: 20.0000 mg | ORAL_TABLET | Freq: Three times a day (TID) | ORAL | 0 refills | Status: DC

## 2022-08-01 NOTE — Telephone Encounter (Signed)
Appointment is scheduled for Jun 20th

## 2022-08-01 NOTE — Telephone Encounter (Signed)
Patient called stating he is needing refills for his Xanax and Ritalin to be sent to [facility].

## 2022-08-01 NOTE — Telephone Encounter (Signed)
10 days of methylphenidate sent in. Xanax was last filled on 5/19 so not due for refill until appt

## 2022-08-11 ENCOUNTER — Telehealth (HOSPITAL_COMMUNITY): Payer: PRIVATE HEALTH INSURANCE | Admitting: Psychiatry

## 2022-08-11 ENCOUNTER — Telehealth (HOSPITAL_COMMUNITY): Payer: Self-pay | Admitting: *Deleted

## 2022-08-11 ENCOUNTER — Other Ambulatory Visit (HOSPITAL_COMMUNITY): Payer: Self-pay | Admitting: Psychiatry

## 2022-08-11 MED ORDER — ALPRAZOLAM 1 MG PO TABS: ORAL_TABLET | ORAL | 2 refills | Status: DC

## 2022-08-11 MED ORDER — METHYLPHENIDATE HCL 20 MG PO TABS: 20.0000 mg | ORAL_TABLET | Freq: Three times a day (TID) | ORAL | 0 refills | Status: DC

## 2022-08-11 NOTE — Telephone Encounter (Signed)
Patient called stating he is going to jail this Saturday. Per pt he will not be able to make an appt for the next 30 days. Per pt he will be out of his medication if provider don't send in his scripts to the pharmacy. Per pt he would need his script sent to [facility]. Per pt he is going to jail in [facility].

## 2022-08-14 DIAGNOSIS — F1411 Cocaine abuse, in remission: Secondary | ICD-10-CM | POA: Diagnosis not present

## 2022-08-14 DIAGNOSIS — F1020 Alcohol dependence, uncomplicated: Secondary | ICD-10-CM | POA: Diagnosis not present

## 2022-09-05 NOTE — Telephone Encounter (Signed)
Informed patient with information

## 2022-09-08 ENCOUNTER — Telehealth (HOSPITAL_COMMUNITY): Payer: Self-pay | Admitting: Licensed Clinical Social Worker

## 2022-09-08 NOTE — Telephone Encounter (Signed)
The therapist returns a call from [facility] who wants to setup an individual therapy appointment with this client who discharges on 09/14/22. She says that he lives in [place] and does not want to go to [facility] as he hates [facility].  She says that he could do some in person appointments if needed provided that most are via tele-health. The therapist schedules him for a CCA on 09/15/22 at 2 p.m. with the patient to arrive at 1:30 p.m. to do new patient paperwork. She is provided with the main number for the office and this therapist's direct number to give to the patient.   Myrna Blazer, MA, LCSW, [facility], LCAS 09/08/2022

## 2022-09-15 ENCOUNTER — Encounter (HOSPITAL_COMMUNITY): Payer: Self-pay | Admitting: Licensed Clinical Social Worker

## 2022-09-15 ENCOUNTER — Ambulatory Visit (HOSPITAL_COMMUNITY): Payer: PRIVATE HEALTH INSURANCE | Admitting: Licensed Clinical Social Worker

## 2022-09-20 ENCOUNTER — Telehealth (HOSPITAL_COMMUNITY): Payer: Medicare HMO | Admitting: Psychiatry

## 2022-09-20 ENCOUNTER — Encounter (HOSPITAL_COMMUNITY): Payer: Self-pay | Admitting: Psychiatry

## 2022-09-20 DIAGNOSIS — F901 Attention-deficit hyperactivity disorder, predominantly hyperactive type: Secondary | ICD-10-CM

## 2022-09-20 DIAGNOSIS — F411 Generalized anxiety disorder: Secondary | ICD-10-CM

## 2022-09-20 MED ORDER — METHYLPHENIDATE HCL 20 MG PO TABS: 20.0000 mg | ORAL_TABLET | Freq: Three times a day (TID) | ORAL | 0 refills | Status: DC

## 2022-09-20 MED ORDER — BUPROPION HCL ER (XL) 150 MG PO TB24: 150.0000 mg | ORAL_TABLET | ORAL | 2 refills | Status: DC

## 2022-09-20 MED ORDER — ALPRAZOLAM 1 MG PO TABS: ORAL_TABLET | ORAL | 2 refills | Status: DC
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
## 2022-12-21 ENCOUNTER — Other Ambulatory Visit (HOSPITAL_COMMUNITY): Payer: Self-pay | Admitting: Psychiatry

## 2022-12-21 ENCOUNTER — Encounter (HOSPITAL_COMMUNITY): Payer: Self-pay | Admitting: Psychiatry

## 2022-12-21 ENCOUNTER — Telehealth (HOSPITAL_COMMUNITY): Payer: PRIVATE HEALTH INSURANCE | Admitting: Psychiatry

## 2022-12-21 ENCOUNTER — Telehealth (HOSPITAL_COMMUNITY): Payer: Self-pay

## 2022-12-21 DIAGNOSIS — F901 Attention-deficit hyperactivity disorder, predominantly hyperactive type: Secondary | ICD-10-CM | POA: Diagnosis not present

## 2022-12-21 DIAGNOSIS — F411 Generalized anxiety disorder: Secondary | ICD-10-CM | POA: Diagnosis not present

## 2022-12-21 MED ORDER — METHYLPHENIDATE HCL 20 MG PO TABS: 20.0000 mg | ORAL_TABLET | Freq: Three times a day (TID) | ORAL | 0 refills | Status: DC

## 2022-12-21 MED ORDER — ALPRAZOLAM 1 MG PO TABS: ORAL_TABLET | ORAL | 2 refills | Status: DC

## 2022-12-21 MED ORDER — BUPROPION HCL ER (XL) 150 MG PO TB24: 150.0000 mg | ORAL_TABLET | ORAL | 2 refills | Status: DC

## 2022-12-21 NOTE — Telephone Encounter (Signed)
[facility] called in needing a new rx for methylphenidate (RITALIN) 20 MG tablet for 01/20/23 it was sent as 30 tablet and needs to be 90. Please advise.

## 2022-12-21 NOTE — Progress Notes (Signed)
Virtual Visit via Telephone Note  I connected with Manuel Kelly on 12/21/22 at  4:20 PM EDT by telephone and verified that I am speaking with the correct person using two identifiers.  Location: Patient: home Provider: office   I discussed the limitations, risks, security and privacy concerns of performing an evaluation and management service by telephone and the availability of in person appointments. I also discussed with the patient that there may be a patient responsible charge related to this service. The patient expressed understanding and agreed to proceed.      I discussed the assessment and treatment plan with the patient. The patient was provided an opportunity to ask questions and all were answered. The patient agreed with the plan and demonstrated an understanding of the instructions.   The patient was advised to call back or seek an in-person evaluation if the symptoms worsen or if the condition fails to improve as anticipated.  I provided 15 minutes of non-face-to-face time during this encounter.   [name], MD  [facility] MD/PA/NP OP Progress Note  12/21/2022 2:58 PM Manuel Kelly  MRN:  [id number]  Chief Complaint:  Chief Complaint  Patient presents with   Anxiety   Depression   ADD   Follow-up   HPI:  This patient is a 52 year old divorced white male who is living with his son in [place].  He is on disability.   The patient returns for follow-up after 3 months.  This is regarding his ADHD anxiety and mild depression.  He states overall he has been doing well.  Last time we reinstated the Wellbutrin and its helped his mood and he denies depressive symptoms or thoughts of self-harm.  He is cut down his smoking to half pack per day.  His anxiety is under good control and he is focused well.  He is still trying to get his driver's license back but has had to go through a lot of hurdles.  He states that he is sleeping well.  He is spending a lot of time going to his  granddaughter's softball games Visit Diagnosis:    ICD-10-CM   1. Attention deficit hyperactivity disorder (ADHD), predominantly hyperactive type  F90.1     2. Generalized anxiety disorder  F41.1       Past Psychiatric History: Past outpatient treatment  Past Medical History:  Past Medical History:  Diagnosis Date   Anxiety    Bulging lumbar disc    Degenerative arthritis    GERD (gastroesophageal reflux disease)    Hypoglycemia     Past Surgical History:  Procedure Laterality Date   CLAVICLE SURGERY     HAND SURGERY      Family Psychiatric History: See below  Family History:  Family History  Problem Relation Age of Onset   Anxiety disorder Mother    Alcohol abuse Father    Anxiety disorder Father    Depression Father    ADD / ADHD Son     Social History:  Social History   Socioeconomic History   Marital status: Divorced    Spouse name: Not on file   Number of children: Not on file   Years of education: Not on file   Highest education level: Not on file  Occupational History   Not on file  Tobacco Use   Smoking status: Every Day    Current packs/day: 1.00    Average packs/day: 1 pack/day for 30.0 years (30.0 ttl pk-yrs)    Types: Cigarettes  Smokeless tobacco: Never  Substance and Sexual Activity   Alcohol use: No   Drug use: No    Comment: per pt he stopped Cocaine 5-6 years ago and did use Marijuana Christmas of 2014   Sexual activity: Yes    Partners: Female    Birth control/protection: Condom  Other Topics Concern   Not on file  Social History Narrative   Not on file   Social Determinants of Health   Financial Resource Strain: Not on file  Food Insecurity: Not on file  Transportation Needs: Not on file  Physical Activity: Not on file  Stress: Not on file  Social Connections: Not on file    Allergies:  Allergies  Allergen Reactions   Other     IV Dye, Throat swells up and rash   Talwin [Pentazocine]     Tongue swell up     Metabolic Disorder Labs: No results found for: "HGBA1C", "MPG" No results found for: "PROLACTIN" No results found for: "CHOL", "TRIG", "HDL", "CHOLHDL", "VLDL", "LDLCALC" No results found for: "TSH"  Therapeutic Level Labs: No results found for: "LITHIUM" No results found for: "VALPROATE" No results found for: "CBMZ"  Current Medications: Current Outpatient Medications  Medication Sig Dispense Refill   ALPRAZolam (XANAX) 1 MG tablet TAKE 1 TABLET BY MOUTH FOUR TIMES DAILY (IN THE MORNING, AT NOON, IN THE EVENING, AT BEDTIME) 120 tablet 2   buPROPion (WELLBUTRIN XL) 150 MG 24 hr tablet Take 1 tablet (150 mg total) by mouth every morning. 30 tablet 2   fluticasone (FLONASE) 50 MCG/ACT nasal spray Place 2 sprays into both nostrils 4 (four) times daily.     lovastatin (MEVACOR) 40 MG tablet Take 40 mg by mouth daily.     methylphenidate (RITALIN) 20 MG tablet Take 1 tablet (20 mg total) by mouth 3 (three) times daily with meals. 90 tablet 0   methylphenidate (RITALIN) 20 MG tablet Take 1 tablet (20 mg total) by mouth 3 (three) times daily with meals. 30 tablet 0   methylphenidate (RITALIN) 20 MG tablet Take 1 tablet (20 mg total) by mouth 3 (three) times daily with meals. 90 tablet 0   NON FORMULARY ?Cotton Liver Capsules? (Mult.) BID     omeprazole (PRILOSEC) 40 MG capsule Take 40 mg by mouth daily.      PROAIR HFA 108 (90 BASE) MCG/ACT inhaler Inhale into the lungs as needed. Reported on 04/29/2015     No current facility-administered medications for this visit.     Musculoskeletal: Strength & Muscle Tone: na Gait & Station: na Patient leans: N/A  Psychiatric Specialty Exam: Review of Systems  All other systems reviewed and are negative.   There were no vitals taken for this visit.There is no height or weight on file to calculate BMI.  General Appearance: Casual and Fairly Groomed  Eye Contact:  Good  Speech:  Clear and Coherent  Volume:  Normal  Mood:  Euthymic   Affect:  NA  Thought Process:  Goal Directed  Orientation:  Full (Time, Place, and Person)  Thought Content: WDL   Suicidal Thoughts:  No  Homicidal Thoughts:  No  Memory:  Immediate;   Good Recent;   Good Remote;   Fair  Judgement:  Fair  Insight:  Fair  Psychomotor Activity:  Normal  Concentration:  Concentration: Good and Attention Span: Good  Recall:  Good  Fund of Knowledge: Good  Language: Good  Akathisia:  No  Handed:  Right  AIMS (if indicated): not done  Assets:  Communication Skills Desire for Improvement Physical Health Resilience Social Support Talents/Skills  ADL's:  Intact  Cognition: WNL  Sleep:  Good   Screenings: PHQ2-9    Flowsheet Row Video Visit from 11/06/2020 in [facility] at [facility] Total Score 0      Flowsheet Row Video Visit from 11/06/2020 in [facility] at [place]  C-SSRS RISK CATEGORY No Risk        Assessment and Plan: This patient is a 52 year old male with a history depression anxiety and ADD.  He seems to be doing well in terms of mood focus and anxiety.  His PDMP was checked and there are no untoward readings.  He will therefore continue Wellbutrin XL 150 mg daily for depression, Xanax 1 mg 4 times daily for anxiety and methylphenidate 20 mg 3 times daily for ADHD.  He will return to see me in 3 months  Collaboration of Care: Collaboration of Care: Primary Care Provider AEB notes will be shared with PCP at patient's request  Patient/Guardian was advised Release of Information must be obtained prior to any record release in order to collaborate their care with an outside provider. Patient/Guardian was advised if they have not already done so to contact the registration department to sign all necessary forms in order for [place] to release information regarding their care.   Consent: Patient/Guardian gives verbal consent for treatment and assignment of benefits for  services provided during this visit. Patient/Guardian expressed understanding and agreed to proceed.    [name], MD 12/21/2022, 2:58 PM

## 2022-12-21 NOTE — Telephone Encounter (Signed)
resent

## 2023-03-23 ENCOUNTER — Telehealth (INDEPENDENT_AMBULATORY_CARE_PROVIDER_SITE_OTHER): Payer: PRIVATE HEALTH INSURANCE | Admitting: Psychiatry

## 2023-03-23 ENCOUNTER — Encounter (HOSPITAL_COMMUNITY): Payer: Self-pay | Admitting: Psychiatry

## 2023-03-23 DIAGNOSIS — F411 Generalized anxiety disorder: Secondary | ICD-10-CM | POA: Diagnosis not present

## 2023-03-23 DIAGNOSIS — F901 Attention-deficit hyperactivity disorder, predominantly hyperactive type: Secondary | ICD-10-CM | POA: Diagnosis not present

## 2023-03-23 MED ORDER — METHYLPHENIDATE HCL 20 MG PO TABS: 20.0000 mg | ORAL_TABLET | Freq: Three times a day (TID) | ORAL | 0 refills | Status: DC

## 2023-03-23 MED ORDER — TRAZODONE HCL 100 MG PO TABS: 100.0000 mg | ORAL_TABLET | Freq: Every day | ORAL | 2 refills | Status: DC

## 2023-03-23 MED ORDER — BUPROPION HCL ER (XL) 150 MG PO TB24: 150.0000 mg | ORAL_TABLET | ORAL | 2 refills | Status: DC

## 2023-03-23 MED ORDER — ALPRAZOLAM 1 MG PO TABS: ORAL_TABLET | ORAL | 2 refills | Status: DC

## 2023-03-23 NOTE — Progress Notes (Signed)
Virtual Visit via Telephone Note  I connected with Manuel Kelly on 03/23/23 at  9:00 AM EST by telephone and verified that I am speaking with the correct person using two identifiers.  Location: Patient: home Provider: office   I discussed the limitations, risks, security and privacy concerns of performing an evaluation and management service by telephone and the availability of in person appointments. I also discussed with the patient that there may be a patient responsible charge related to this service. The patient expressed understanding and agreed to proceed.      I discussed the assessment and treatment plan with the patient. The patient was provided an opportunity to ask questions and all were answered. The patient agreed with the plan and demonstrated an understanding of the instructions.   The patient was advised to call back or seek an in-person evaluation if the symptoms worsen or if the condition fails to improve as anticipated.  I provided 20 minutes of non-face-to-face time during this encounter.   [name], MD  [name]  03/23/2023 9:27 AM Manuel Kelly  MRN:  [id number]  Chief Complaint:  Chief Complaint  Patient presents with   Depression   Anxiety   ADHD   Follow-up   HPI: This patient is a 53 year old divorced white male who is living with his son in [place].  He is on disability.   The patient returns for follow-up after 3 months regarding his ADHD anxiety and depression.  He states he went through a very traumatic time lately.  His fiance had been incarcerated and got out in September.  Apparently she was pending some fentanyl charges and was supposed to go back to court in January.  He states that she had been clean for quite some time but on Christmas got back with her son and they started using fentanyl again.  She used it for a week straight by his report.  He had to give her Narcan several times.  He states that on New Year's  Day he woke up and found her on the floor unresponsive.  He could not find any more fentanyl and he thinks that she hit it.  He and his son's girlfriend at [employer] and [facility] came.  Apparently she was in the hospital for several days but finally was declared brain dead at.  Since then he has had a very difficult time.  He states that he keeps seeing her laying on the floor dad in his mind.  He is having a lot of trouble sleeping.  He is sad and depressed but is getting good support from his pastor and is talking to them several times a day as well as to other church members.  He is still taking all of his medications.  He does admit he is used more Xanax and I explained to him that he can only take what is prescribed.  I will add trazodone to help him sleep.  He denies any thoughts of suicide or self-harm. Visit Diagnosis:    ICD-10-CM   1. Attention deficit hyperactivity disorder (ADHD), predominantly hyperactive type  F90.1     2. Generalized anxiety disorder  F41.1       Past Psychiatric History: Past outpatient treatment  Past Medical History:  Past Medical History:  Diagnosis Date   Anxiety    Bulging lumbar disc    Degenerative arthritis    GERD (gastroesophageal reflux disease)    Hypoglycemia     Past  Surgical History:  Procedure Laterality Date   CLAVICLE SURGERY     HAND SURGERY      Family Psychiatric History: See below  Family History:  Family History  Problem Relation Age of Onset   Anxiety disorder Mother    Alcohol abuse Father    Anxiety disorder Father    Depression Father    ADD / ADHD Son     Social History:  Social History   Socioeconomic History   Marital status: Divorced    Spouse name: Not on file   Number of children: Not on file   Years of education: Not on file   Highest education level: Not on file  Occupational History   Not on file  Tobacco Use   Smoking status: Every Day    Current packs/day: 1.00    Average packs/day: 1 pack/day for 30.0  years (30.0 ttl pk-yrs)    Types: Cigarettes   Smokeless tobacco: Never  Substance and Sexual Activity   Alcohol use: No   Drug use: No    Comment: per pt he stopped Cocaine 5-6 years ago and did use Marijuana Christmas of 2014   Sexual activity: Yes    Partners: Female    Birth control/protection: Condom  Other Topics Concern   Not on file  Social History Narrative   Not on file   Social Drivers of Health   Financial Resource Strain: Not on file  Food Insecurity: Not on file  Transportation Needs: Not on file  Physical Activity: Not on file  Stress: Not on file  Social Connections: Not on file    Allergies:  Allergies  Allergen Reactions   Other     IV Dye, Throat swells up and rash   Talwin [Pentazocine]     Tongue swell up    Metabolic Disorder Labs: No results found for: "HGBA1C", "MPG" No results found for: "PROLACTIN" No results found for: "CHOL", "TRIG", "HDL", "CHOLHDL", "VLDL", "LDLCALC" No results found for: "TSH"  Therapeutic Level Labs: No results found for: "LITHIUM" No results found for: "VALPROATE" No results found for: "CBMZ"  Current Medications: Current Outpatient Medications  Medication Sig Dispense Refill   traZODone (DESYREL) 100 MG tablet Take 1 tablet (100 mg total) by mouth at bedtime. 30 tablet 2   ALPRAZolam (XANAX) 1 MG tablet TAKE 1 TABLET BY MOUTH FOUR TIMES DAILY (IN THE MORNING, AT NOON, IN THE EVENING, AT BEDTIME) 120 tablet 2   buPROPion (WELLBUTRIN XL) 150 MG 24 hr tablet Take 1 tablet (150 mg total) by mouth every morning. 30 tablet 2   fluticasone (FLONASE) 50 MCG/ACT nasal spray Place 2 sprays into both nostrils 4 (four) times daily.     lovastatin (MEVACOR) 40 MG tablet Take 40 mg by mouth daily.     methylphenidate (RITALIN) 20 MG tablet Take 1 tablet (20 mg total) by mouth 3 (three) times daily with meals. 90 tablet 0   methylphenidate (RITALIN) 20 MG tablet Take 1 tablet (20 mg total) by mouth 3 (three) times daily with  meals. 90 tablet 0   methylphenidate (RITALIN) 20 MG tablet Take 1 tablet (20 mg total) by mouth 3 (three) times daily with meals. 90 tablet 0   NON FORMULARY ?Cotton Liver Capsules? (Mult.) BID     omeprazole (PRILOSEC) 40 MG capsule Take 40 mg by mouth daily.      PROAIR HFA 108 (90 BASE) MCG/ACT inhaler Inhale into the lungs as needed. Reported on 04/29/2015     No  current facility-administered medications for this visit.     Musculoskeletal: Strength & Muscle Tone: na Gait & Station: na Patient leans: N/A  Psychiatric Specialty Exam: Review of Systems  Psychiatric/Behavioral:  Positive for dysphoric mood and sleep disturbance.   All other systems reviewed and are negative.   There were no vitals taken for this visit.There is no height or weight on file to calculate BMI.  General Appearance: NA  Eye Contact:  NA  Speech:  Clear and Coherent  Volume:  Normal  Mood:  Depressed  Affect:  NA  Thought Process:  Goal Directed  Orientation:  Full (Time, Place, and Person)  Thought Content: Rumination   Suicidal Thoughts:  No  Homicidal Thoughts:  No  Memory:  Immediate;   Good Recent;   Good Remote;   Good  Judgement:  Good  Insight:  Good  Psychomotor Activity:  Decreased  Concentration:  Concentration: Fair and Attention Span: Fair  Recall:  Good  Fund of Knowledge: Good  Language: Good  Akathisia:  No  Handed:  Right  AIMS (if indicated): not done  Assets:  Communication Skills Desire for Improvement Resilience Social Support  ADL's:  Intact  Cognition: WNL  Sleep:  Poor   Screenings: PHQ2-9    Flowsheet Row Video Visit from 11/06/2020 in [facility] at [facility] Total Score 0      Flowsheet Row Video Visit from 11/06/2020 in [facility] at [place]  C-SSRS RISK CATEGORY No Risk        Assessment and Plan: This patient is a 53 year old male with a history of depression anxiety and ADD.   Unfortunately he is just gone through a very traumatic experience and is having trouble sleeping.  We will add trazodone 100 mg at bedtime for his insomnia.  He will continue Wellbutrin XL 150 mg daily for depression, Xanax 1 mg 4 times daily for anxiety and methylphenidate 20 mg 3 times daily for ADHD.  Will return to see me in 4 weeks  Collaboration of Care: Collaboration of Care: Primary Care Provider AEB notes will be shared with PCP at patient's request  Patient/Guardian was advised Release of Information must be obtained prior to any record release in order to collaborate their care with an outside provider. Patient/Guardian was advised if they have not already done so to contact the registration department to sign all necessary forms in order for [place] to release information regarding their care.   Consent: Patient/Guardian gives verbal consent for treatment and assignment of benefits for services provided during this visit. Patient/Guardian expressed understanding and agreed to proceed.    [name], MD 03/23/2023, 9:27 AM

## 2023-03-31 DIAGNOSIS — Z79899 Other long term (current) drug therapy: Secondary | ICD-10-CM | POA: Diagnosis not present

## 2023-03-31 DIAGNOSIS — Z8619 Personal history of other infectious and parasitic diseases: Secondary | ICD-10-CM | POA: Diagnosis not present

## 2023-04-11 DIAGNOSIS — T5391XA Toxic effect of unspecified halogen derivatives of aliphatic and aromatic hydrocarbons, accidental (unintentional), initial encounter: Secondary | ICD-10-CM | POA: Diagnosis not present

## 2023-04-11 DIAGNOSIS — R519 Headache, unspecified: Secondary | ICD-10-CM | POA: Diagnosis not present

## 2023-04-11 DIAGNOSIS — J029 Acute pharyngitis, unspecified: Secondary | ICD-10-CM | POA: Diagnosis not present

## 2023-04-11 DIAGNOSIS — Z79899 Other long term (current) drug therapy: Secondary | ICD-10-CM | POA: Diagnosis not present

## 2023-04-11 DIAGNOSIS — Z91041 Radiographic dye allergy status: Secondary | ICD-10-CM | POA: Diagnosis not present

## 2023-04-11 DIAGNOSIS — T59891A Toxic effect of other specified gases, fumes and vapors, accidental (unintentional), initial encounter: Secondary | ICD-10-CM | POA: Diagnosis not present

## 2023-04-11 DIAGNOSIS — Z7951 Long term (current) use of inhaled steroids: Secondary | ICD-10-CM | POA: Diagnosis not present

## 2023-04-11 DIAGNOSIS — J449 Chronic obstructive pulmonary disease, unspecified: Secondary | ICD-10-CM | POA: Diagnosis not present

## 2023-04-11 DIAGNOSIS — J019 Acute sinusitis, unspecified: Secondary | ICD-10-CM | POA: Diagnosis not present

## 2023-04-11 DIAGNOSIS — Z720 Tobacco use: Secondary | ICD-10-CM | POA: Diagnosis not present

## 2023-04-11 DIAGNOSIS — F17210 Nicotine dependence, cigarettes, uncomplicated: Secondary | ICD-10-CM | POA: Diagnosis not present

## 2023-04-20 ENCOUNTER — Other Ambulatory Visit (HOSPITAL_COMMUNITY): Payer: Self-pay | Admitting: Psychiatry

## 2023-04-20 ENCOUNTER — Telehealth (HOSPITAL_COMMUNITY): Payer: PRIVATE HEALTH INSURANCE | Admitting: Psychiatry

## 2023-04-20 ENCOUNTER — Telehealth (HOSPITAL_COMMUNITY): Payer: Self-pay | Admitting: *Deleted

## 2023-04-20 NOTE — Telephone Encounter (Signed)
 All have refills except methylphenidate

## 2023-04-20 NOTE — Telephone Encounter (Signed)
 Patient called stating he is needing his refills for his Xanax, Ritalin, Wellbutrin and Trazodone.

## 2023-04-25 NOTE — Telephone Encounter (Signed)
 Per pt he received all of his medications that were requested.

## 2023-04-27 ENCOUNTER — Telehealth (INDEPENDENT_AMBULATORY_CARE_PROVIDER_SITE_OTHER): Payer: 59 | Admitting: Psychiatry

## 2023-04-27 ENCOUNTER — Encounter (HOSPITAL_COMMUNITY): Payer: Self-pay | Admitting: Psychiatry

## 2023-04-27 DIAGNOSIS — F901 Attention-deficit hyperactivity disorder, predominantly hyperactive type: Secondary | ICD-10-CM | POA: Diagnosis not present

## 2023-04-27 DIAGNOSIS — F411 Generalized anxiety disorder: Secondary | ICD-10-CM | POA: Diagnosis not present

## 2023-04-27 MED ORDER — ALPRAZOLAM 1 MG PO TABS: ORAL_TABLET | ORAL | 2 refills | Status: DC

## 2023-04-27 MED ORDER — METHYLPHENIDATE HCL 20 MG PO TABS: 20.0000 mg | ORAL_TABLET | Freq: Three times a day (TID) | ORAL | 0 refills | Status: DC

## 2023-04-27 MED ORDER — TRAZODONE HCL 100 MG PO TABS: 200.0000 mg | ORAL_TABLET | Freq: Every day | ORAL | 2 refills | Status: DC

## 2023-04-27 MED ORDER — BUPROPION HCL ER (XL) 150 MG PO TB24: 150.0000 mg | ORAL_TABLET | ORAL | 2 refills | Status: DC

## 2023-04-27 NOTE — Progress Notes (Signed)
 Virtual Visit via Telephone Note  I connected with Manuel Kelly on 04/27/23 at  1:40 PM EST by telephone and verified that I am speaking with the correct person using two identifiers.  Location: Patient: home Provider: office   I discussed the limitations, risks, security and privacy concerns of performing an evaluation and management service by telephone and the availability of in person appointments. I also discussed with the patient that there may be a patient responsible charge related to this service. The patient expressed understanding and agreed to proceed.      I discussed the assessment and treatment plan with the patient. The patient was provided an opportunity to ask questions and all were answered. The patient agreed with the plan and demonstrated an understanding of the instructions.   The patient was advised to call back or seek an in-person evaluation if the symptoms worsen or if the condition fails to improve as anticipated.  I provided 20 minutes of non-face-to-face time during this encounter.   [name], MD  [facility] MD/PA/NP OP Progress Note  04/27/2023 1:51 PM Manuel Kelly  MRN:  [id number]  Chief Complaint:  Chief Complaint  Patient presents with   Depression   Anxiety   ADHD   Follow-up   HPI: This patient is a 53 year old divorced white male who is living with his son in [place].  He is on disability.  The patient returns for follow-up after 2 months regarding his ADHD anxiety and depression.  As noted last time his fiance passed away of probable fentanyl overdose on New Year's Day.  He is still going through the grieving process for this.  He is trying to stay busy taking care of his granddaughter.  He is still not sleeping well with the trazodone 100 mg so I suggested that we go up on the dosage before giving up on it.  He still feels down most of the time but does feel the medications are still somewhat helpful.  PDMP was checked and he is filling all  his controlled medications appropriately. Visit Diagnosis:    ICD-10-CM   1. Attention deficit hyperactivity disorder (ADHD), predominantly hyperactive type  F90.1     2. Generalized anxiety disorder  F41.1       Past Psychiatric History: Past outpatient treatment  Past Medical History:  Past Medical History:  Diagnosis Date   Anxiety    Bulging lumbar disc    Degenerative arthritis    GERD (gastroesophageal reflux disease)    Hypoglycemia     Past Surgical History:  Procedure Laterality Date   CLAVICLE SURGERY     HAND SURGERY      Family Psychiatric History: see below  Family History:  Family History  Problem Relation Age of Onset   Anxiety disorder Mother    Alcohol abuse Father    Anxiety disorder Father    Depression Father    ADD / ADHD Son     Social History:  Social History   Socioeconomic History   Marital status: Divorced    Spouse name: Not on file   Number of children: Not on file   Years of education: Not on file   Highest education level: Not on file  Occupational History   Not on file  Tobacco Use   Smoking status: Every Day    Current packs/day: 1.00    Average packs/day: 1 pack/day for 30.0 years (30.0 ttl pk-yrs)    Types: Cigarettes   Smokeless tobacco: Never  Substance and Sexual Activity   Alcohol use: No   Drug use: No    Comment: per pt he stopped Cocaine 5-6 years ago and did use Marijuana Christmas of 2014   Sexual activity: Yes    Partners: Female    Birth control/protection: Condom  Other Topics Concern   Not on file  Social History Narrative   Not on file   Social Drivers of Health   Financial Resource Strain: Not on file  Food Insecurity: Not on file  Transportation Needs: Not on file  Physical Activity: Not on file  Stress: Not on file  Social Connections: Not on file    Allergies:  Allergies  Allergen Reactions   Other     IV Dye, Throat swells up and rash   Talwin [Pentazocine]     Tongue swell up     Metabolic Disorder Labs: No results found for: "HGBA1C", "MPG" No results found for: "PROLACTIN" No results found for: "CHOL", "TRIG", "HDL", "CHOLHDL", "VLDL", "LDLCALC" No results found for: "TSH"  Therapeutic Level Labs: No results found for: "LITHIUM" No results found for: "VALPROATE" No results found for: "CBMZ"  Current Medications: Current Outpatient Medications  Medication Sig Dispense Refill   ALPRAZolam (XANAX) 1 MG tablet TAKE 1 TABLET BY MOUTH FOUR TIMES DAILY (IN THE MORNING, AT NOON, IN THE EVENING, AT BEDTIME) 120 tablet 2   buPROPion (WELLBUTRIN XL) 150 MG 24 hr tablet Take 1 tablet (150 mg total) by mouth every morning. 30 tablet 2   fluticasone (FLONASE) 50 MCG/ACT nasal spray Place 2 sprays into both nostrils 4 (four) times daily.     lovastatin (MEVACOR) 40 MG tablet Take 40 mg by mouth daily.     methylphenidate (RITALIN) 20 MG tablet Take 1 tablet (20 mg total) by mouth 3 (three) times daily with meals. 90 tablet 0   methylphenidate (RITALIN) 20 MG tablet Take 1 tablet (20 mg total) by mouth 3 (three) times daily with meals. 90 tablet 0   methylphenidate (RITALIN) 20 MG tablet Take 1 tablet (20 mg total) by mouth 3 (three) times daily with meals. 90 tablet 0   NON FORMULARY ?Cotton Liver Capsules? (Mult.) BID     omeprazole (PRILOSEC) 40 MG capsule Take 40 mg by mouth daily.      PROAIR HFA 108 (90 BASE) MCG/ACT inhaler Inhale into the lungs as needed. Reported on 04/29/2015     traZODone (DESYREL) 100 MG tablet Take 2 tablets (200 mg total) by mouth at bedtime. 60 tablet 2   No current facility-administered medications for this visit.     Musculoskeletal: Strength & Muscle Tone: na Gait & Station: na Patient leans: N/A  Psychiatric Specialty Exam: Review of Systems  Psychiatric/Behavioral:  Positive for dysphoric mood and sleep disturbance.   All other systems reviewed and are negative.   There were no vitals taken for this visit.There is no height  or weight on file to calculate BMI.  General Appearance: na  Eye Contact:  NA  Speech:  Clear and Coherent  Volume:  Normal  Mood:  Dysphoric  Affect:  NA  Thought Process:  Goal Directed  Orientation:  Full (Time, Place, and Person)  Thought Content: Rumination   Suicidal Thoughts:  No  Homicidal Thoughts:  No  Memory:  Immediate;   Good Recent;   Good Remote;   NA  Judgement:  Good  Insight:  Fair  Psychomotor Activity:  Decreased  Concentration:  Concentration: Good and Attention Span: Good  Recall:  Good  Fund of Knowledge: Good  Language: Good  Akathisia:  No  Handed:  Right  AIMS (if indicated): not done  Assets:  Communication Skills Desire for Improvement Physical Health Resilience Social Support  ADL's:  Intact  Cognition: WNL  Sleep:  Poor   Screenings: PHQ2-9    Flowsheet Row Video Visit from 11/06/2020 in [facility] at [facility] Total Score 0      Flowsheet Row Video Visit from 11/06/2020 in [facility] at [place]  C-SSRS RISK CATEGORY No Risk        Assessment and Plan: This patient is a 53 year old male with a history of depression anxiety and ADD.  He has recently lost his fiance which has made his life more difficult.  However he is starting to get out and do more particular with his family.  Since he is not sleeping well we will increase trazodone to 100 mg at bedtime for sleep.  He will continue Wellbutrin XL 150 mg daily for depression, Xanax 1 mg 4 times daily for anxiety and methylphenidate 20 mg 3 times daily for ADHD.  He will return to see me in 2 months  Collaboration of Care: Collaboration of Care: Primary Care Provider AEB notes will be shared with PCP at patient's request  Patient/Guardian was advised Release of Information must be obtained prior to any record release in order to collaborate their care with an outside provider. Patient/Guardian was advised if they have  not already done so to contact the registration department to sign all necessary forms in order for [place] to release information regarding their care.   Consent: Patient/Guardian gives verbal consent for treatment and assignment of benefits for services provided during this visit. Patient/Guardian expressed understanding and agreed to proceed.    [name], MD 04/27/2023, 1:51 PM

## 2023-07-04 DIAGNOSIS — R7303 Prediabetes: Secondary | ICD-10-CM | POA: Diagnosis not present

## 2023-07-04 DIAGNOSIS — R062 Wheezing: Secondary | ICD-10-CM | POA: Diagnosis not present

## 2023-07-04 DIAGNOSIS — J302 Other seasonal allergic rhinitis: Secondary | ICD-10-CM | POA: Diagnosis not present

## 2023-07-14 ENCOUNTER — Other Ambulatory Visit (HOSPITAL_COMMUNITY): Payer: Self-pay | Admitting: Psychiatry

## 2023-08-21 ENCOUNTER — Other Ambulatory Visit (HOSPITAL_COMMUNITY): Payer: Self-pay | Admitting: Psychiatry

## 2023-08-21 ENCOUNTER — Telehealth (HOSPITAL_COMMUNITY): Payer: Self-pay | Admitting: *Deleted

## 2023-08-21 NOTE — Telephone Encounter (Signed)
 PATIENT REQUEST  methylphenidate  (RITALIN ) 20 MG tablet 04/27/23 --   [facility] - [place], [place] - [address]

## 2023-08-21 NOTE — Telephone Encounter (Signed)
 sent

## 2023-08-23 DIAGNOSIS — Z1211 Encounter for screening for malignant neoplasm of colon: Secondary | ICD-10-CM | POA: Diagnosis not present

## 2023-09-10 ENCOUNTER — Other Ambulatory Visit (HOSPITAL_COMMUNITY): Payer: Self-pay | Admitting: Psychiatry

## 2023-09-11 NOTE — Telephone Encounter (Signed)
 Call for appt

## 2023-09-13 NOTE — Telephone Encounter (Signed)
Called no answer left vm 

## 2023-09-15 ENCOUNTER — Other Ambulatory Visit (HOSPITAL_COMMUNITY): Payer: Self-pay | Admitting: Psychiatry

## 2023-09-16 NOTE — Telephone Encounter (Signed)
 Call for appt

## 2023-10-05 ENCOUNTER — Other Ambulatory Visit (HOSPITAL_COMMUNITY): Payer: Self-pay | Admitting: Psychiatry

## 2023-10-05 DIAGNOSIS — Z79891 Long term (current) use of opiate analgesic: Secondary | ICD-10-CM | POA: Diagnosis not present

## 2023-10-05 DIAGNOSIS — K621 Rectal polyp: Secondary | ICD-10-CM | POA: Diagnosis not present

## 2023-10-05 DIAGNOSIS — K635 Polyp of colon: Secondary | ICD-10-CM | POA: Diagnosis not present

## 2023-10-05 DIAGNOSIS — F17210 Nicotine dependence, cigarettes, uncomplicated: Secondary | ICD-10-CM | POA: Diagnosis not present

## 2023-10-05 DIAGNOSIS — D122 Benign neoplasm of ascending colon: Secondary | ICD-10-CM | POA: Diagnosis not present

## 2023-10-05 DIAGNOSIS — D128 Benign neoplasm of rectum: Secondary | ICD-10-CM | POA: Diagnosis not present

## 2023-10-05 DIAGNOSIS — Z79899 Other long term (current) drug therapy: Secondary | ICD-10-CM | POA: Diagnosis not present

## 2023-10-05 DIAGNOSIS — J449 Chronic obstructive pulmonary disease, unspecified: Secondary | ICD-10-CM | POA: Diagnosis not present

## 2023-10-05 DIAGNOSIS — Z1211 Encounter for screening for malignant neoplasm of colon: Secondary | ICD-10-CM | POA: Diagnosis not present

## 2023-10-05 DIAGNOSIS — D121 Benign neoplasm of appendix: Secondary | ICD-10-CM | POA: Diagnosis not present

## 2023-10-05 DIAGNOSIS — Z860101 Personal history of adenomatous and serrated colon polyps: Secondary | ICD-10-CM | POA: Diagnosis not present

## 2023-10-05 NOTE — Telephone Encounter (Signed)
 Call for appt

## 2023-10-24 ENCOUNTER — Other Ambulatory Visit (HOSPITAL_COMMUNITY): Payer: Self-pay | Admitting: Psychiatry

## 2023-10-24 NOTE — Telephone Encounter (Signed)
 Call for appt

## 2023-10-25 ENCOUNTER — Other Ambulatory Visit (HOSPITAL_COMMUNITY): Payer: Self-pay | Admitting: Psychiatry

## 2023-10-26 ENCOUNTER — Telehealth (HOSPITAL_COMMUNITY): Admitting: Psychiatry

## 2023-10-26 ENCOUNTER — Encounter (HOSPITAL_COMMUNITY): Payer: Self-pay | Admitting: Psychiatry

## 2023-10-26 DIAGNOSIS — F901 Attention-deficit hyperactivity disorder, predominantly hyperactive type: Secondary | ICD-10-CM | POA: Diagnosis not present

## 2023-10-26 MED ORDER — BUPROPION HCL ER (XL) 150 MG PO TB24: 150.0000 mg | ORAL_TABLET | ORAL | 2 refills | Status: DC

## 2023-10-26 MED ORDER — METHYLPHENIDATE HCL 20 MG PO TABS
20.0000 mg | ORAL_TABLET | Freq: Three times a day (TID) | ORAL | 0 refills | Status: DC
Start: 2023-10-26 — End: 2024-01-10

## 2023-10-26 MED ORDER — ZOLPIDEM TARTRATE ER 12.5 MG PO TBCR: 12.5000 mg | EXTENDED_RELEASE_TABLET | Freq: Every day | ORAL | 2 refills | Status: DC

## 2023-10-26 MED ORDER — METHYLPHENIDATE HCL 20 MG PO TABS: 20.0000 mg | ORAL_TABLET | Freq: Three times a day (TID) | ORAL | 0 refills | Status: DC

## 2023-10-26 MED ORDER — ALPRAZOLAM 1 MG PO TABS: ORAL_TABLET | ORAL | 0 refills | Status: DC

## 2023-10-26 NOTE — Progress Notes (Signed)
 Virtual Visit via Telephone Note  I connected with Manuel Kelly on 10/26/23 at  9:20 AM EDT by telephone and verified that I am speaking with the correct person using two identifiers.  Location: Patient: home Provider: office   I discussed the limitations, risks, security and privacy concerns of performing an evaluation and management service by telephone and the availability of in person appointments. I also discussed with the patient that there may be a patient responsible charge related to this service. The patient expressed understanding and agreed to proceed.      I discussed the assessment and treatment plan with the patient. The patient was provided an opportunity to ask questions and all were answered. The patient agreed with the plan and demonstrated an understanding of the instructions.   The patient was advised to call back or seek an in-person evaluation if the symptoms worsen or if the condition fails to improve as anticipated.  I provided 20 minutes of non-face-to-face time during this encounter.   [name], MD  [facility] MD/PA/NP OP Progress Note  10/26/2023 9:30 AM Manuel Kelly  MRN:  [id number]  Chief Complaint:  Chief Complaint  Patient presents with   Anxiety   Depression   ADD   Follow-up   HPI: This patient is a 53 year old divorced white male who is living with his son in [place].  He is on disability.  The patient returns for follow-up after about 5 months regarding his ADHD anxiety and depression.  As noted last time his fiance passed away of probable fentanyl overdose on New Year's Day.  He still obviously sad about this but he is trying to stay busy and active.  He spends a lot of time with his 77 year old granddaughter.  He does yard work and tries to keep the property up.  He still having a lot of trouble sleeping.  We increased trazodone  to 200 mg but is not helping him he is only sleeping 3 to 4 hours per night.  He has tried Restoril  and  amitriptyline which eventually did not work.  I explained that we could try Ambien  CR but he cannot take it with Xanax  and he voices understanding.  The Xanax  continues to help his anxiety.  He states the Wellbutrin  has helped his depression and methylphenidate  has helped him to stay focused.  PDMP was reviewed and he is filling prescriptions appropriately.  Because he has missed appointments I have given him 30-day supplies when he is called. Visit Diagnosis:    ICD-10-CM   1. Attention deficit hyperactivity disorder (ADHD), predominantly hyperactive type  F90.1 methylphenidate  (RITALIN ) 20 MG tablet    methylphenidate  (RITALIN ) 20 MG tablet      Past Psychiatric History: Past outpatient treatment  Past Medical History:  Past Medical History:  Diagnosis Date   Anxiety    Bulging lumbar disc    Degenerative arthritis    GERD (gastroesophageal reflux disease)    Hypoglycemia     Past Surgical History:  Procedure Laterality Date   CLAVICLE SURGERY     HAND SURGERY      Family Psychiatric History: See below  Family History:  Family History  Problem Relation Age of Onset   Anxiety disorder Mother    Alcohol abuse Father    Anxiety disorder Father    Depression Father    ADD / ADHD Son     Social History:  Social History   Socioeconomic History   Marital status: Divorced    Spouse  name: Not on file   Number of children: Not on file   Years of education: Not on file   Highest education level: Not on file  Occupational History   Not on file  Tobacco Use   Smoking status: Every Day    Current packs/day: 1.00    Average packs/day: 1 pack/day for 30.0 years (30.0 ttl pk-yrs)    Types: Cigarettes   Smokeless tobacco: Never  Substance and Sexual Activity   Alcohol use: No   Drug use: No    Comment: per pt he stopped Cocaine 5-6 years ago and did use Marijuana Christmas of 2014   Sexual activity: Yes    Partners: Female    Birth control/protection: Condom  Other Topics  Concern   Not on file  Social History Narrative   Not on file   Social Drivers of Health   Financial Resource Strain: Not on file  Food Insecurity: Not on file  Transportation Needs: Not on file  Physical Activity: Not on file  Stress: Not on file  Social Connections: Not on file    Allergies:  Allergies  Allergen Reactions   Other     IV Dye, Throat swells up and rash   Talwin [Pentazocine]     Tongue swell up    Metabolic Disorder Labs: No results found for: HGBA1C, MPG No results found for: PROLACTIN No results found for: CHOL, TRIG, HDL, CHOLHDL, VLDL, LDLCALC No results found for: TSH  Therapeutic Level Labs: No results found for: LITHIUM No results found for: VALPROATE No results found for: CBMZ  Current Medications: Current Outpatient Medications  Medication Sig Dispense Refill   zolpidem  (AMBIEN  CR) 12.5 MG CR tablet Take 1 tablet (12.5 mg total) by mouth at bedtime. 30 tablet 2   ALPRAZolam  (XANAX ) 1 MG tablet TAKE 1 TABLET BY MOUTH FOUR TIMES DAILY (1 IN THE MORNING, 1 AT NOON, 1 IN THE EVENING, AND 1 AT BEDTIME) 120 tablet 0   buPROPion  (WELLBUTRIN  XL) 150 MG 24 hr tablet Take 1 tablet (150 mg total) by mouth every morning. 30 tablet 2   fluticasone (FLONASE) 50 MCG/ACT nasal spray Place 2 sprays into both nostrils 4 (four) times daily.     lovastatin (MEVACOR) 40 MG tablet Take 40 mg by mouth daily.     methylphenidate  (RITALIN ) 20 MG tablet Take 1 tablet (20 mg total) by mouth 3 (three) times daily with meals. 90 tablet 0   methylphenidate  (RITALIN ) 20 MG tablet Take 1 tablet (20 mg total) by mouth 3 (three) times daily with meals. 90 tablet 0   methylphenidate  (RITALIN ) 20 MG tablet Take 1 tablet (20 mg total) by mouth 3 (three) times daily with meals. 90 tablet 0   NON FORMULARY ?Cotton Liver Capsules? (Mult.) BID     omeprazole (PRILOSEC) 40 MG capsule Take 40 mg by mouth daily.      PROAIR HFA 108 (90 BASE) MCG/ACT inhaler  Inhale into the lungs as needed. Reported on 04/29/2015     No current facility-administered medications for this visit.     Musculoskeletal: Strength & Muscle Tone: na Gait & Station: na Patient leans: N/A  Psychiatric Specialty Exam: Review of Systems  Psychiatric/Behavioral:  Positive for sleep disturbance.   All other systems reviewed and are negative.   There were no vitals taken for this visit.There is no height or weight on file to calculate BMI.  General Appearance: NA  Eye Contact:  NA  Speech:  Clear and Coherent  Volume:  Normal  Mood:  Dysphoric  Affect:  NA  Thought Process:  Goal Directed  Orientation:  Full (Time, Place, and Person)  Thought Content: Rumination   Suicidal Thoughts:  No  Homicidal Thoughts:  No  Memory:  Immediate;   Good Recent;   Good Remote;   NA  Judgement:  Fair  Insight:  Fair  Psychomotor Activity:  Normal  Concentration:  Concentration: Good and Attention Span: Good  Recall:  Good  Fund of Knowledge: Good  Language: Good  Akathisia:  No  Handed:  Right  AIMS (if indicated): not done  Assets:  Communication Skills Desire for Improvement Physical Health Resilience Social Support  ADL's:  Intact  Cognition: WNL  Sleep:  Good   Screenings: PHQ2-9    Flowsheet Row Video Visit from 11/06/2020 in [facility] at [facility] Total Score 0   Flowsheet Row Video Visit from 11/06/2020 in [facility] at [place]  C-SSRS RISK CATEGORY No Risk     Assessment and Plan: This patient is a 53 year old male with a history depression anxiety and ADD.  He is still not sleeping well even with 200 mg of trazodone  so we will try Ambien  CR 12.5 mg at bedtime.  He was warned not to combine this with Xanax .  He will continue Xanax  1 mg 4 times daily for anxiety, methylphenidate  20 mg 3 times daily for ADHD and Wellbutrin  XL 150 mg daily for depression.  He will return to see me in 3  months  Collaboration of Care: Collaboration of Care: Primary Care Provider AEB notes will be shared with PCP at patient's request  Patient/Guardian was advised Release of Information must be obtained prior to any record release in order to collaborate their care with an outside provider. Patient/Guardian was advised if they have not already done so to contact the registration department to sign all necessary forms in order for us  to release information regarding their care.   Consent: Patient/Guardian gives verbal consent for treatment and assignment of benefits for services provided during this visit. Patient/Guardian expressed understanding and agreed to proceed.    [name], MD 10/26/2023, 9:30 AM

## 2023-11-02 ENCOUNTER — Other Ambulatory Visit (HOSPITAL_COMMUNITY): Payer: Self-pay | Admitting: Psychiatry

## 2023-11-02 MED ORDER — ZOLPIDEM TARTRATE 10 MG PO TABS: 10.0000 mg | ORAL_TABLET | Freq: Every evening | ORAL | 2 refills | Status: DC | PRN

## 2023-12-20 ENCOUNTER — Other Ambulatory Visit (HOSPITAL_COMMUNITY): Payer: Self-pay | Admitting: Psychiatry

## 2024-01-01 ENCOUNTER — Telehealth (HOSPITAL_COMMUNITY): Admitting: Psychiatry

## 2024-01-08 ENCOUNTER — Encounter (HOSPITAL_COMMUNITY): Payer: Self-pay | Admitting: Psychiatry

## 2024-01-08 ENCOUNTER — Telehealth (INDEPENDENT_AMBULATORY_CARE_PROVIDER_SITE_OTHER): Admitting: Psychiatry

## 2024-01-08 DIAGNOSIS — F411 Generalized anxiety disorder: Secondary | ICD-10-CM | POA: Diagnosis not present

## 2024-01-08 DIAGNOSIS — F901 Attention-deficit hyperactivity disorder, predominantly hyperactive type: Secondary | ICD-10-CM | POA: Diagnosis not present

## 2024-01-08 DIAGNOSIS — F332 Major depressive disorder, recurrent severe without psychotic features: Secondary | ICD-10-CM | POA: Diagnosis not present

## 2024-01-08 MED ORDER — METHYLPHENIDATE HCL 20 MG PO TABS: 20.0000 mg | ORAL_TABLET | Freq: Three times a day (TID) | ORAL | 0 refills | Status: DC

## 2024-01-08 MED ORDER — ALPRAZOLAM 1 MG PO TABS: ORAL_TABLET | ORAL | 0 refills | Status: DC

## 2024-01-08 MED ORDER — BUPROPION HCL ER (XL) 150 MG PO TB24: 150.0000 mg | ORAL_TABLET | ORAL | 2 refills | Status: DC

## 2024-01-08 MED ORDER — QUETIAPINE FUMARATE 50 MG PO TABS: 50.0000 mg | ORAL_TABLET | Freq: Every day | ORAL | 2 refills | Status: DC

## 2024-01-08 NOTE — Progress Notes (Signed)
 Virtual Visit via Telephone Note  I connected with Manuel Kelly on 01/08/24 at  1:00 PM EST by telephone and verified that I am speaking with the correct person using two identifiers.  Location: Patient:home Provider: office   I discussed the limitations, risks, security and privacy concerns of performing an evaluation and management service by telephone and the availability of in person appointments. I also discussed with the patient that there may be a patient responsible charge related to this service. The patient expressed understanding and agreed to proceed.      I discussed the assessment and treatment plan with the patient. The patient was provided an opportunity to ask questions and all were answered. The patient agreed with the plan and demonstrated an understanding of the instructions.   The patient was advised to call back or seek an in-person evaluation if the symptoms worsen or if the condition fails to improve as anticipated.  I provided 20 minutes of non-face-to-face time during this encounter.   Manuel Gull, MD  [facility] MD/PA/NP OP Progress Note  01/08/2024 1:19 PM Manuel Kelly  MRN:  [id number]  Chief Complaint:  Chief Complaint  Patient presents with   Anxiety   Depression   ADD   Follow-up   HPI: This patient is a 53 year old divorced white male who is living with his son in [place]. He is on disability.   The patient returns for follow-up after about 3 months regarding his major depression, generalized anxiety disorder and ADD.  He states he has not been doing well lately he is still mourning the loss of his fiance who died last New Year's eve of a drug overdose.  He states as a holidays approach he has been feeling more and more depressed.  He is not able to sleep no matter what he takes.  We have tried Restoril  trazodone  amitriptyline and Ambien  and none of them have helped.  He tries to stay busy doing yard work during the day.  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  When asked about suicidal ideation he thinks he would be better off dead but does not have any plan to harm himself.  We discussed this at length and I urged him to go to the [facility] for evaluation he states he will do so later the day when his son gets home.  He states he would not harm himself between now and then.  I suggested we try Seroquel to help him with his sleep and racing thoughts and he is in agreement but this might change if he is admitted Visit Diagnosis:    ICD-10-CM   1. Severe episode of recurrent major depressive disorder, without psychotic features (HCC)  F33.2     2. Attention deficit hyperactivity disorder (ADHD), predominantly hyperactive type  F90.1     3. Generalized anxiety disorder  F41.1       Past Psychiatric History: Past outpatient treatment  Past Medical History:  Past Medical History:  Diagnosis Date   Anxiety    Bulging lumbar disc    Degenerative arthritis    GERD (gastroesophageal reflux disease)    Hypoglycemia     Past Surgical History:  Procedure Laterality Date   CLAVICLE SURGERY     HAND SURGERY      Family Psychiatric History: See below  Family History:  Family History  Problem Relation Age of  Onset   Anxiety disorder Mother    Alcohol abuse Father    Anxiety disorder Father    Depression Father    ADD / ADHD Son     Social History:  Social History   Socioeconomic History   Marital status: Divorced    Spouse name: Not on file   Number of children: Not on file   Years of education: Not on file   Highest education level: Not on file  Occupational History   Not on file  Tobacco Use   Smoking status: Every Day    Current packs/day: 1.00    Average packs/day: 1 pack/day for 30.0 years (30.0 ttl pk-yrs)    Types: Cigarettes   Smokeless tobacco: Never  Substance and Sexual Activity   Alcohol use: No    Drug use: No    Comment: per pt he stopped Cocaine 5-6 years ago and did use Marijuana Christmas of 2014   Sexual activity: Yes    Partners: Female    Birth control/protection: Condom  Other Topics Concern   Not on file  Social History Narrative   Not on file   Social Drivers of Health   Financial Resource Strain: Not on file  Food Insecurity: Not on file  Transportation Needs: Not on file  Physical Activity: Not on file  Stress: Not on file  Social Connections: Not on file    Allergies:  Allergies  Allergen Reactions   Other     IV Dye, Throat swells up and rash   Talwin [Pentazocine]     Tongue swell up    Metabolic Disorder Labs: No results found for: HGBA1C, MPG No results found for: PROLACTIN No results found for: CHOL, TRIG, HDL, CHOLHDL, VLDL, LDLCALC No results found for: TSH  Therapeutic Level Labs: No results found for: LITHIUM No results found for: VALPROATE No results found for: CBMZ  Current Medications: Current Outpatient Medications  Medication Sig Dispense Refill   QUEtiapine (SEROQUEL) 50 MG tablet Take 1 tablet (50 mg total) by mouth at bedtime. 30 tablet 2   ALPRAZolam  (XANAX ) 1 MG tablet TAKE 1 TABLET BY MOUTH FOUR TIMES DAILY (1 IN THE MORNING, 1 AT NOON, 1 IN THE EVENING, AND 1 AT BEDTIME) 120 tablet 0   buPROPion  (WELLBUTRIN  XL) 150 MG 24 hr tablet Take 1 tablet (150 mg total) by mouth every morning. 30 tablet 2   fluticasone (FLONASE) 50 MCG/ACT nasal spray Place 2 sprays into both nostrils 4 (four) times daily.     lovastatin (MEVACOR) 40 MG tablet Take 40 mg by mouth daily.     methylphenidate  (RITALIN ) 20 MG tablet Take 1 tablet (20 mg total) by mouth 3 (three) times daily with meals. 90 tablet 0   methylphenidate  (RITALIN ) 20 MG tablet Take 1 tablet (20 mg total) by mouth 3 (three) times daily with meals. 90 tablet 0   methylphenidate  (RITALIN ) 20 MG tablet Take 1 tablet (20 mg total) by mouth 3 (three) times  daily with meals. 90 tablet 0   NON FORMULARY ?Cotton Liver Capsules? (Mult.) BID     omeprazole (PRILOSEC) 40 MG capsule Take 40 mg by mouth daily.      PROAIR HFA 108 (90 BASE) MCG/ACT inhaler Inhale into the lungs as needed. Reported on 04/29/2015     No current facility-administered medications for this visit.     Musculoskeletal: Strength & Muscle Tone: na Gait & Station: na Patient leans: N/A  Psychiatric Specialty Exam: Review of Systems  Psychiatric/Behavioral:  Positive for dysphoric mood, sleep disturbance and suicidal ideas. The patient is nervous/anxious.   All other systems reviewed and are negative.   There were no vitals taken for this visit.There is no height or weight on file to calculate BMI.  General Appearance: NA  Eye Contact:  NA  Speech:  Clear and Coherent  Volume:  Decreased  Mood:  Anxious and Depressed  Affect:  NA  Thought Process:  Goal Directed  Orientation:  Full (Time, Place, and Person)  Thought Content: Rumination   Suicidal Thoughts:  Yes.  without intent/plan  Homicidal Thoughts:  No  Memory:  Immediate;   Good Recent;   Fair Remote;   NA  Judgement:  Fair  Insight:  Fair  Psychomotor Activity:  Decreased  Concentration:  Concentration: Fair and Attention Span: Fair  Recall:  Good  Fund of Knowledge: Fair  Language: Good  Akathisia:  No  Handed:  Right  AIMS (if indicated): not done  Assets:  Communication Skills Resilience Social Support  ADL's:  Intact  Cognition: WNL  Sleep:  Poor   Screenings: PHQ2-9    Flowsheet Row Video Visit from 11/06/2020 in [facility] at [facility] Total Score 0   Flowsheet Row Video Visit from 11/06/2020 in [facility] at [place]  C-SSRS RISK CATEGORY No Risk     Assessment and Plan: This patient is a 53 year old male seems to be getting more more depressed as we approach the holidays and the anniversary of the fianc's death.   He states that he has reached a point where he thinks he needs to be hospitalized.  He does have passive suicidal ideation but no specific plan.  He agrees to go to the behavioral health urgent care center in [place] later today.  I did send in his medications just in case these include Xanax  1 mg 4 times daily for anxiety, methylphenidate  20 mg 3 times daily for ADD and Wellbutrin  XL 150 mg daily for depression.  We can try Seroquel 50 mg at bedtime for racing thoughts and sleep.  He will return to see me in 4 weeks or whenever he gets out of presumed hospital.  Collaboration of Care: Collaboration of Care: Other patient has been referred to the [facility] behavioral health urgent care center for evaluation.  Patient/Guardian was advised Release of Information must be obtained prior to any record release in order to collaborate their care with an outside provider. Patient/Guardian was advised if they have not already done so to contact the registration department to sign all necessary forms in order for us  to release information regarding their care.   Consent: Patient/Guardian gives verbal consent for treatment and assignment of benefits for services provided during this visit. Patient/Guardian expressed understanding and agreed to proceed.    Manuel Gull, MD 01/08/2024, 1:19 PM

## 2024-01-08 NOTE — Progress Notes (Deleted)
 [facility] MD/PA/NP OP Progress Note  01/08/2024 1:18 PM Manuel Kelly  MRN:  [id number]  Chief Complaint: No chief complaint on file.  HPI: *** Visit Diagnosis:    ICD-10-CM   1. Severe episode of recurrent major depressive disorder, without psychotic features (HCC)  F33.2     2. Attention deficit hyperactivity disorder (ADHD), predominantly hyperactive type  F90.1     3. Generalized anxiety disorder  F41.1       Past Psychiatric History: ***  Past Medical History:  Past Medical History:  Diagnosis Date   Anxiety    Bulging lumbar disc    Degenerative arthritis    GERD (gastroesophageal reflux disease)    Hypoglycemia     Past Surgical History:  Procedure Laterality Date   CLAVICLE SURGERY     HAND SURGERY      Family Psychiatric History: ***  Family History:  Family History  Problem Relation Age of Onset   Anxiety disorder Mother    Alcohol abuse Father    Anxiety disorder Father    Depression Father    ADD / ADHD Son     Social History:  Social History   Socioeconomic History   Marital status: Divorced    Spouse name: Not on file   Number of children: Not on file   Years of education: Not on file   Highest education level: Not on file  Occupational History   Not on file  Tobacco Use   Smoking status: Every Day    Current packs/day: 1.00    Average packs/day: 1 pack/day for 30.0 years (30.0 ttl pk-yrs)    Types: Cigarettes   Smokeless tobacco: Never  Substance and Sexual Activity   Alcohol use: No   Drug use: No    Comment: per pt he stopped Cocaine 5-6 years ago and did use Marijuana Christmas of 2014   Sexual activity: Yes    Partners: Female    Birth control/protection: Condom  Other Topics Concern   Not on file  Social History Narrative   Not on file   Social Drivers of Health   Financial Resource Strain: Not on file  Food Insecurity: Not on file  Transportation Needs: Not on file  Physical Activity: Not on file  Stress: Not on file   Social Connections: Not on file    Allergies:  Allergies  Allergen Reactions   Other     IV Dye, Throat swells up and rash   Talwin [Pentazocine]     Tongue swell up    Metabolic Disorder Labs: No results found for: HGBA1C, MPG No results found for: PROLACTIN No results found for: CHOL, TRIG, HDL, CHOLHDL, VLDL, LDLCALC No results found for: TSH  Therapeutic Level Labs: No results found for: LITHIUM No results found for: VALPROATE No results found for: CBMZ  Current Medications: Current Outpatient Medications  Medication Sig Dispense Refill   QUEtiapine (SEROQUEL) 50 MG tablet Take 1 tablet (50 mg total) by mouth at bedtime. 30 tablet 2   ALPRAZolam  (XANAX ) 1 MG tablet TAKE 1 TABLET BY MOUTH FOUR TIMES DAILY (1 IN THE MORNING, 1 AT NOON, 1 IN THE EVENING, AND 1 AT BEDTIME) 120 tablet 0   buPROPion  (WELLBUTRIN  XL) 150 MG 24 hr tablet Take 1 tablet (150 mg total) by mouth every morning. 30 tablet 2   fluticasone (FLONASE) 50 MCG/ACT nasal spray Place 2 sprays into both nostrils 4 (four) times daily.     lovastatin (MEVACOR) 40 MG tablet Take 40  mg by mouth daily.     methylphenidate  (RITALIN ) 20 MG tablet Take 1 tablet (20 mg total) by mouth 3 (three) times daily with meals. 90 tablet 0   methylphenidate  (RITALIN ) 20 MG tablet Take 1 tablet (20 mg total) by mouth 3 (three) times daily with meals. 90 tablet 0   methylphenidate  (RITALIN ) 20 MG tablet Take 1 tablet (20 mg total) by mouth 3 (three) times daily with meals. 90 tablet 0   NON FORMULARY ?Cotton Liver Capsules? (Mult.) BID     omeprazole (PRILOSEC) 40 MG capsule Take 40 mg by mouth daily.      PROAIR HFA 108 (90 BASE) MCG/ACT inhaler Inhale into the lungs as needed. Reported on 04/29/2015     No current facility-administered medications for this visit.     Musculoskeletal: Strength & Muscle Tone: {desc; muscle tone:32375} Gait & Station: {PE GAIT ED WJUO:77474} Patient leans: {Patient  Leans:[id number]}  Psychiatric Specialty Exam: Review of Systems  There were no vitals taken for this visit.There is no height or weight on file to calculate BMI.  General Appearance: {Appearance:22683}  Eye Contact:  {BHH EYE CONTACT:22684}  Speech:  {Speech:22685}  Volume:  {Volume (PAA):22686}  Mood:  {BHH MOOD:22306}  Affect:  {Affect (PAA):22687}  Thought Process:  {Thought Process (PAA):22688}  Orientation:  {BHH ORIENTATION (PAA):22689}  Thought Content: {Thought Content:22690}   Suicidal Thoughts:  {ST/HT (PAA):22692}  Homicidal Thoughts:  {ST/HT (PAA):22692}  Memory:  {BHH MEMORY:22881}  Judgement:  {Judgement (PAA):22694}  Insight:  {Insight (PAA):22695}  Psychomotor Activity:  {Psychomotor (PAA):22696}  Concentration:  {Concentration:21399}  Recall:  {BHH GOOD/FAIR/POOR:22877}  Fund of Knowledge: {BHH GOOD/FAIR/POOR:22877}  Language: {BHH GOOD/FAIR/POOR:22877}  Akathisia:  {BHH YES OR NO:22294}  Handed:  {Handed:22697}  AIMS (if indicated): {Desc; done/not:10129}  Assets:  {Assets (PAA):22698}  ADL's:  {BHH JIO'D:77709}  Cognition: {chl bhh cognition:304700322}  Sleep:  {BHH GOOD/FAIR/POOR:22877}   Screenings: PHQ2-9    Flowsheet Row Video Visit from 11/06/2020 in [place] [facility] at [facility] Total Score 0   Flowsheet Row Video Visit from 11/06/2020 in [facility] at [place]  C-SSRS RISK CATEGORY No Risk     Assessment and Plan: ***  Collaboration of Care: Collaboration of Care: [facility] OP Collaboration of Care:[id number]}  Patient/Guardian was advised Release of Information must be obtained prior to any record release in order to collaborate their care with an outside provider. Patient/Guardian was advised if they have not already done so to contact the registration department to sign all necessary forms in order for us  to release information regarding their care.   Consent: Patient/Guardian gives  verbal consent for treatment and assignment of benefits for services provided during this visit. Patient/Guardian expressed understanding and agreed to proceed.    [name], MD 01/08/2024, 1:18 PM

## 2024-01-09 ENCOUNTER — Ambulatory Visit (HOSPITAL_COMMUNITY)
Admission: EM | Admit: 2024-01-09 | Discharge: 2024-01-10 | Disposition: A | Discharge status: Other Acute Inpatient Hospital | Attending: Psychiatry | Admitting: Psychiatry

## 2024-01-09 DIAGNOSIS — F332 Major depressive disorder, recurrent severe without psychotic features: Secondary | ICD-10-CM | POA: Diagnosis not present

## 2024-01-09 DIAGNOSIS — D72829 Elevated white blood cell count, unspecified: Secondary | ICD-10-CM | POA: Diagnosis not present

## 2024-01-09 DIAGNOSIS — F1410 Cocaine abuse, uncomplicated: Secondary | ICD-10-CM | POA: Diagnosis not present

## 2024-01-09 DIAGNOSIS — F1910 Other psychoactive substance abuse, uncomplicated: Secondary | ICD-10-CM

## 2024-01-09 LAB — CBC WITH DIFFERENTIAL/PLATELET
Basophils Absolute: 0 K/uL (ref 0.0–0.1)
Basophils Relative: 0 %
Eosinophils Absolute: 0.5 K/uL (ref 0.0–0.5)
Eosinophils Relative: 4 %
HCT: 41.4 % (ref 39.0–52.0)
Hemoglobin: 14.6 g/dL (ref 13.0–17.0)
Lymphocytes Relative: 38 %
Lymphs Abs: 4.8 K/uL — ABNORMAL HIGH (ref 0.7–4.0)
MCH: 31.7 pg (ref 26.0–34.0)
MCHC: 35.3 g/dL (ref 30.0–36.0)
MCV: 89.8 fL (ref 80.0–100.0)
Monocytes Absolute: 0.4 K/uL (ref 0.1–1.0)
Monocytes Relative: 3 %
Neutro Abs: 6.9 K/uL (ref 1.7–7.7)
Neutrophils Relative %: 55 %
Platelets: 274 K/uL (ref 150–400)
RBC: 4.61 MIL/uL (ref 4.22–5.81)
RDW: 12.7 % (ref 11.5–15.5)
WBC: 12.6 K/uL — ABNORMAL HIGH (ref 4.0–10.5)
nRBC: 0 % (ref 0.0–0.2)

## 2024-01-09 LAB — COMPREHENSIVE METABOLIC PANEL WITH GFR
ALT: 21 U/L (ref 0–44)
AST: 21 U/L (ref 15–41)
Albumin: 3.8 g/dL (ref 3.5–5.0)
Alkaline Phosphatase: 69 U/L (ref 38–126)
Anion gap: 14 (ref 5–15)
BUN: 34 mg/dL — ABNORMAL HIGH (ref 6–20)
CO2: 20 mmol/L — ABNORMAL LOW (ref 22–32)
Calcium: 9.8 mg/dL (ref 8.9–10.3)
Chloride: 104 mmol/L (ref 98–111)
Creatinine, Ser: 1.25 mg/dL — ABNORMAL HIGH (ref 0.61–1.24)
GFR, Estimated: >60 mL/min (ref >60)
Glucose, Bld: 78 mg/dL (ref 70–99)
Potassium: 4 mmol/L (ref 3.5–5.1)
Sodium: 138 mmol/L (ref 135–145)
Total Bilirubin: 0.8 mg/dL (ref 0.0–1.2)
Total Protein: 7 g/dL (ref 6.5–8.1)

## 2024-01-09 LAB — POCT URINE DRUG SCREEN - MANUAL ENTRY (I-SCREEN)
POC Amphetamine UR: NOT DETECTED
POC Buprenorphine (BUP): NOT DETECTED
POC Cocaine UR: POSITIVE — AB
POC Marijuana UR: NOT DETECTED
POC Methadone UR: NOT DETECTED
POC Methamphetamine UR: NOT DETECTED
POC Morphine: NOT DETECTED
POC Oxazepam (BZO): NOT DETECTED
POC Oxycodone UR: NOT DETECTED
POC Secobarbital (BAR): NOT DETECTED

## 2024-01-09 LAB — ETHANOL: Alcohol, Ethyl (B): <15 mg/dL (ref <15)

## 2024-01-09 LAB — TSH: TSH: 3.508 u[IU]/mL (ref 0.350–4.500)

## 2024-01-09 MED ORDER — ACETAMINOPHEN 325 MG PO TABS
650.0000 mg | ORAL_TABLET | Freq: Four times a day (QID) | ORAL | Status: DC | PRN
  Administered 2024-01-09 – 2024-01-10 (×2): 650 mg via ORAL
  Filled 2024-01-09 (×2): qty 2

## 2024-01-09 MED ORDER — DIPHENHYDRAMINE HCL 50 MG/ML IJ SOLN: 50.0000 mg | Freq: Three times a day (TID) | INTRAMUSCULAR | Status: DC | PRN

## 2024-01-09 MED ORDER — HALOPERIDOL 5 MG PO TABS: 5.0000 mg | ORAL_TABLET | Freq: Three times a day (TID) | ORAL | Status: DC | PRN

## 2024-01-09 MED ORDER — ALUM & MAG HYDROXIDE-SIMETH 200-200-20 MG/5ML PO SUSP: 30.0000 mL | ORAL | Status: DC | PRN

## 2024-01-09 MED ORDER — HALOPERIDOL LACTATE 5 MG/ML IJ SOLN: 5.0000 mg | Freq: Three times a day (TID) | INTRAMUSCULAR | Status: DC | PRN

## 2024-01-09 MED ORDER — DIPHENHYDRAMINE HCL 50 MG PO CAPS: 50.0000 mg | ORAL_CAPSULE | Freq: Three times a day (TID) | ORAL | Status: DC | PRN

## 2024-01-09 MED ORDER — TRAZODONE HCL 50 MG PO TABS
50.0000 mg | ORAL_TABLET | Freq: Every evening | ORAL | Status: DC | PRN
  Administered 2024-01-09: 50 mg via ORAL
  Filled 2024-01-09: qty 1

## 2024-01-09 MED ORDER — HALOPERIDOL LACTATE 5 MG/ML IJ SOLN: 10.0000 mg | Freq: Three times a day (TID) | INTRAMUSCULAR | Status: DC | PRN

## 2024-01-09 MED ORDER — LORAZEPAM 2 MG/ML IJ SOLN: 2.0000 mg | Freq: Three times a day (TID) | INTRAMUSCULAR | Status: DC | PRN

## 2024-01-09 MED ORDER — MAGNESIUM HYDROXIDE 400 MG/5ML PO SUSP: 30.0000 mL | Freq: Every day | ORAL | Status: DC | PRN
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
## 2024-01-09 NOTE — ED Provider Notes (Signed)
 [facility] Continuous Assessment Admission H&P  Date: 01/10/24 Patient Name: Manuel Kelly MRN: [id number] Chief Complaint: wanting rehab from cocaine  Diagnoses:  Final diagnoses:  Substance abuse (HCC)  Cocaine abuse [facility])    HPI: Manuel Kelly, 53 y/o male with a history of absence abuse (cocaine), mania, ADD, depression.  Presented to [facility], stating he is seeking rehab from cocaine.  According to him he is cocaine on a regular basis he has been using since he was 53 years old.  Patient also stated that he does see a med provider Dr. [name] in [place].  Patient is currently not seeing a therapist at this time.  According to him he lives with his son and grandkids who they share property together.  Patient stated he does not work.   Face-to-face evaluation of patient, patient is alert and oriented x 4, speech is clear, maintaining eye contact.  Patient denies SI, HI, AVH or paranoia at this present moment.  Reports he use cocaine on a regular basis and has been using it as he was 53 years old.  Patient also reports he used alcohol but stating he only use it probably once a week.  It is unclear if patient is serious about rehab or patient is looking for medication as per the interview patient keeps saying that although I get Xanax  and I get Adderall and I get Ambien 's all which are controlled substances review of the PDMP show that patient is prescribed these medicines but it is questionable if patient is using other substance is patient really using the medicines for what they are designated for.  Writer discussed with patient that even though he is prescribed those medicine on the outside they will be tailored to meet the treatment plan.  Patient does appear little bit anxious.  Given patient history.  Recommend observation and then further evaluation in the a.m. to see if patient is serious about wanting to do rehab for cocaine.  Total Time spent with patient: 30  minutes  Musculoskeletal  Strength & Muscle Tone: within normal limits Gait & Station: normal Patient leans: N/A  Psychiatric Specialty Exam  Presentation General Appearance:  Casual  Eye Contact: Fair  Speech: Clear and Coherent  Speech Volume: Normal  Handedness: Right   Mood and Affect  Mood: Anxious  Affect: Congruent   Thought Process  Thought Processes: Coherent  Descriptions of Associations:Intact  Orientation:Full (Time, Place and Person)  Thought Content:WDL  Diagnosis of Schizophrenia or Schizoaffective disorder in past: No   Hallucinations:Hallucinations: None  Ideas of Reference:None  Suicidal Thoughts:Suicidal Thoughts: No  Homicidal Thoughts:Homicidal Thoughts: No   Sensorium  Memory: Immediate Fair  Judgment: Poor  Insight: Fair   Chartered Certified Accountant: Fair  Attention Span: Fair  Recall: Fair  Fund of Knowledge: Fair  Language: Fair   Psychomotor Activity  Psychomotor Activity: Psychomotor Activity: Normal   Assets  Assets: Desire for Improvement; Vocational/Educational   Sleep  Sleep: Sleep: Fair Number of Hours of Sleep: 7   Nutritional Assessment (For OBS and FBC admissions only) Has the patient had a weight loss or gain of 10 pounds or more in the last 3 months?: No Has the patient had a decrease in food intake/or appetite?: No Does the patient have dental problems?: No Does the patient have eating habits or behaviors that may be indicators of an eating disorder including binging or inducing vomiting?: No Has the patient recently lost weight without trying?: 0 Has the patient  been eating poorly because of a decreased appetite?: 0 Malnutrition Screening Tool Score: 0    Physical Exam HENT:     Head: Normocephalic.     Nose: Nose normal.  Eyes:     Pupils: Pupils are equal, round, and reactive to light.  Cardiovascular:     Rate and Rhythm: Normal rate.  Pulmonary:      Effort: Pulmonary effort is normal.  Musculoskeletal:        General: Normal range of motion.     Cervical back: Normal range of motion.  Neurological:     General: No focal deficit present.     Mental Status: He is alert.  Psychiatric:        Mood and Affect: Mood normal.        Behavior: Behavior normal.        Thought Content: Thought content normal.    Review of Systems  Constitutional: Negative.   HENT: Negative.    Eyes: Negative.   Respiratory: Negative.    Cardiovascular: Negative.   Gastrointestinal: Negative.   Genitourinary: Negative.   Musculoskeletal: Negative.   Skin: Negative.   Neurological: Negative.   Psychiatric/Behavioral:  Positive for substance abuse. The patient is nervous/anxious.     Blood pressure (!) 132/90, pulse (!) 105, temperature 98.4 F (36.9 C), temperature source Oral, resp. rate 18, SpO2 98%. There is no height or weight on file to calculate BMI.  Past Psychiatric History: Substance abuse, depression, anxiety, ADHD,  Is the patient at risk to self? No  Has the patient been a risk to self in the past 6 months? No .    Has the patient been a risk to self within the distant past? No   Is the patient a risk to others? No   Has the patient been a risk to others in the past 6 months? No   Has the patient been a risk to others within the distant past? No   Past Medical History: See chart  Family History: Unknown  Social History: Cocaine, alcohol  Last Labs:  Admission on 01/09/2024  Component Date Value Ref Range Status   WBC 01/09/2024 12.6 (H)  4.0 - 10.5 K/uL Final   RBC 01/09/2024 4.61  4.22 - 5.81 MIL/uL Final   Hemoglobin 01/09/2024 14.6  13.0 - 17.0 g/dL Final   HCT 88/81/7974 41.4  39.0 - 52.0 % Final   MCV 01/09/2024 89.8  80.0 - 100.0 fL Final   MCH 01/09/2024 31.7  26.0 - 34.0 pg Final   MCHC 01/09/2024 35.3  30.0 - 36.0 g/dL Final   RDW 88/81/7974 12.7  11.5 - 15.5 % Final   Platelets 01/09/2024 274  150 - 400 K/uL Final    nRBC 01/09/2024 0.0  0.0 - 0.2 % Final   Neutrophils Relative % 01/09/2024 55  % Final   Neutro Abs 01/09/2024 6.9  1.7 - 7.7 K/uL Final   Lymphocytes Relative 01/09/2024 38  % Final   Lymphs Abs 01/09/2024 4.8 (H)  0.7 - 4.0 K/uL Final   Monocytes Relative 01/09/2024 3  % Final   Monocytes Absolute 01/09/2024 0.4  0.1 - 1.0 K/uL Final   Eosinophils Relative 01/09/2024 4  % Final   Eosinophils Absolute 01/09/2024 0.5  0.0 - 0.5 K/uL Final   Basophils Relative 01/09/2024 0  % Final   Basophils Absolute 01/09/2024 0.0  0.0 - 0.1 K/uL Final   WBC Morphology 01/09/2024 See Note   Final    Morphology unremarkable  RBC Morphology 01/09/2024 MORPHOLOGY UNREMARKABLE   Final    Morphology unremarkable   Smear Review 01/09/2024 See Note   Final   Comment:  Normal Platelet Morphology Performed at [facility], [address], [place], [place] [address]    Sodium 01/09/2024 138  135 - 145 mmol/L Final   Potassium 01/09/2024 4.0  3.5 - 5.1 mmol/L Final   Chloride 01/09/2024 104  98 - 111 mmol/L Final   CO2 01/09/2024 20 (L)  22 - 32 mmol/L Final   Glucose, Bld 01/09/2024 78  70 - 99 mg/dL Final   Glucose reference range applies only to samples taken after fasting for at least 8 hours.   BUN 01/09/2024 34 (H)  6 - 20 mg/dL Final   Creatinine, Ser 01/09/2024 1.25 (H)  0.61 - 1.24 mg/dL Final   Calcium 88/81/7974 9.8  8.9 - 10.3 mg/dL Final   Total Protein 88/81/7974 7.0  6.5 - 8.1 g/dL Final   Albumin 88/81/7974 3.8  3.5 - 5.0 g/dL Final   AST 88/81/7974 21  15 - 41 U/L Final   ALT 01/09/2024 21  0 - 44 U/L Final   Alkaline Phosphatase 01/09/2024 69  38 - 126 U/L Final   Total Bilirubin 01/09/2024 0.8  0.0 - 1.2 mg/dL Final   GFR, Estimated 01/09/2024 >60  >60 mL/min Final   Comment: (NOTE) Calculated using the CKD-EPI Creatinine Equation (2021)    Anion gap 01/09/2024 14  5 - 15 Final   Performed at [facility], [address] [place], [place] [address]   Alcohol, Ethyl  (B) 01/09/2024 <15  <15 mg/dL Final   Comment: (NOTE) For medical purposes only. Performed at [facility], [address], [place], [place] [address]    TSH 01/09/2024 3.508  0.350 - 4.500 uIU/mL Final   Comment: Performed by a 3rd Generation assay with a functional sensitivity of <=0.01 uIU/mL. Performed at [facility], [address], [place], [place] [address]    POC Amphetamine  UR 01/09/2024 None Detected  NONE DETECTED (Cut Off Level 1000 ng/mL) Final   POC Secobarbital (BAR) 01/09/2024 None Detected  NONE DETECTED (Cut Off Level 300 ng/mL) Final   POC Buprenorphine (BUP) 01/09/2024 None Detected  NONE DETECTED (Cut Off Level 10 ng/mL) Final   POC Oxazepam (BZO) 01/09/2024 None Detected  NONE DETECTED (Cut Off Level 300 ng/mL) Final   POC Cocaine UR 01/09/2024 Positive (A)  NONE DETECTED (Cut Off Level 300 ng/mL) Final   POC Methamphetamine UR 01/09/2024 None Detected  NONE DETECTED (Cut Off Level 1000 ng/mL) Final   POC Morphine 01/09/2024 None Detected  NONE DETECTED (Cut Off Level 300 ng/mL) Final   POC Methadone UR 01/09/2024 None Detected  NONE DETECTED (Cut Off Level 300 ng/mL) Final   POC Oxycodone UR 01/09/2024 None Detected  NONE DETECTED (Cut Off Level 100 ng/mL) Final   POC Marijuana UR 01/09/2024 None Detected  NONE DETECTED (Cut Off Level 50 ng/mL) Final    Allergies: Other and Talwin [pentazocine]  Medications:  Facility Ordered Medications  Medication   acetaminophen (TYLENOL) tablet 650 mg   alum & mag hydroxide-simeth (MAALOX/MYLANTA) 200-200-20 MG/5ML suspension 30 mL   magnesium hydroxide (MILK OF MAGNESIA) suspension 30 mL   haloperidol (HALDOL) tablet 5 mg   And   diphenhydrAMINE (BENADRYL) capsule 50 mg   haloperidol lactate (HALDOL) injection 5 mg   And   diphenhydrAMINE (BENADRYL) injection 50 mg   And  LORazepam  (ATIVAN ) injection 2 mg   haloperidol  lactate (HALDOL ) injection 10 mg   And   diphenhydrAMINE  (BENADRYL ) injection 50 mg    And   LORazepam  (ATIVAN ) injection 2 mg   traZODone  (DESYREL ) tablet 50 mg   PTA Medications  Medication Sig   omeprazole (PRILOSEC) 40 MG capsule Take 40 mg by mouth daily.    PROAIR HFA 108 (90 BASE) MCG/ACT inhaler Inhale into the lungs as needed. Reported on 04/29/2015   fluticasone  (FLONASE ) 50 MCG/ACT nasal spray Place 2 sprays into both nostrils 4 (four) times daily.   lovastatin (MEVACOR) 40 MG tablet Take 40 mg by mouth daily.   NON FORMULARY ?C[place] Liver Capsules? (Mult.) BID   methylphenidate  (RITALIN ) 20 MG tablet Take 1 tablet (20 mg total) by mouth 3 (three) times daily with meals.   methylphenidate  (RITALIN ) 20 MG tablet Take 1 tablet (20 mg total) by mouth 3 (three) times daily with meals.   QUEtiapine  (SEROQUEL ) 50 MG tablet Take 1 tablet (50 mg total) by mouth at bedtime.   buPROPion  (WELLBUTRIN  XL) 150 MG 24 hr tablet Take 1 tablet (150 mg total) by mouth every morning.   ALPRAZolam  (XANAX ) 1 MG tablet TAKE 1 TABLET BY MOUTH FOUR TIMES DAILY (1 IN THE MORNING, 1 AT NOON, 1 IN THE EVENING, AND 1 AT BEDTIME)   methylphenidate  (RITALIN ) 20 MG tablet Take 1 tablet (20 mg total) by mouth 3 (three) times daily with meals.      Medical Decision Making  Revision unit    Recommendations  Based on my evaluation the patient does not appear to have an emergency medical condition.  Gaither Pouch, NP 01/10/24  4:07 AM

## 2024-01-09 NOTE — BH Assessment (Addendum)
 Comprehensive Clinical Assessment (CCA) Note  01/09/2024 Manuel Kelly [id number]  Disposition: Assessment completed.  Disposition pending provider evaluation.   The patient demonstrates the following risk factors for suicide: Chronic risk factors for suicide include: psychiatric disorder of MDD, ADHD, GAD and substance use disorder. Acute risk factors for suicide include: social withdrawal/isolation and loss (financial, interpersonal, professional). Protective factors for this patient include: positive social support, positive therapeutic relationship, responsibility to others (children, family), and hope for the future. Considering these factors, the overall suicide risk at this point appears to be low to moderate. Patient is appropriate for outpatient follow up.  Patient is a 53 year old male/ with a history of Major Depressive Disorder, recurrent,severe w/o psychotic fx, ADHD and GAD who presents voluntarily to [facility] for assessment.  Patient presents accompanied by his son's girlfriend's cousin.  He began by explaining he has a drug addiction. Patient is somewhat disorganized upon assessment, struggling to provide use patterns/hx of Cocaine use.  He states he began using at age 11, and for years up until 10/26/2021 I used every day(unable to provide amounts he used).  Patient then goes into a story about his son finding him at his property, which he is working on, in [place]  yesterday with a crack pipe in my hand.  He believes someone may have laced his drink, as he does not recall buying or even using cocaine.  His son informed him that he seemed paralyzed which patient states he has never experienced with cocaine use.  He began looking up this symptoms and thinks someone may have laced my drink with Kratum.   He then requests inpatient treatment, however it seems he is requesting detox.  He briefly mentions losing his girlfriend and finding her body earlier this year  during triage, however this isn't mentioned during the assessment.  Instead, he was focused on needing to find out what happened to him and what is in his system.  He also mentions he went to the [facility] 30 day Residential [facility] program 1 year ago.  He had endorsed passive SI, with no plan during triage.  He denies HI and AVH.    Upon chart review, patient saw his provider, Dr. Adrien Kelly with [facility] in [place] yesterday.  Per her note, This patient is a 53 year old male seems to be getting more more depressed as we approach the holidays and the anniversary of the fianc's death. He states that he has reached a point where he thinks he needs to be hospitalized. He does have passive suicidal ideation but no specific plan. He agrees to go to the behavioral health urgent care center in [place] later today. I did send in his medications just in case these include Xanax  1 mg 4 times daily for anxiety, methylphenidate  20 mg 3 times daily for ADD and Wellbutrin  XL 150 mg daily for depression. We can try Seroquel 50 mg at bedtime for racing thoughts and sleep. He will return to see me in 4 weeks or whenever he gets out of presumed hospital.   Chief Complaint:  Chief Complaint  Patient presents with   Addiction Problem   Suicidal Ideation   Visit Diagnosis: Major Depressive Disorder, recurrent, severe w/o psychotic fx                             ADHD  GAD    CCA Screening, Triage and Referral (STR)  Patient Reported Information How did you hear about us ? Family/Friend  What Is the Reason for Your Visit/Call Today? Manuel Kelly 53y male presents to [facility] accompanied by his son's girlfriend's cousin, voluntarily. PT explains that he has a drug addiction. PT states he is addicted to crack cocaine and has symptoms of paranoia. PT shares hx of being in rehab. PT states he has been diagnosed w/ mental health disorders and is unable to name them - states Manuel Kelly can  tell you all of that. PT mentions that he is scared to be alone right now and shares that he found his gf dead at the beginning of year and has been going through grief. PT endorses SI w/ no plan. PT denies HI, AVH and alcohol use.  How Long Has This Been Causing You Problems? > than 6 months  What Do You Feel Would Help You the Most Today? Alcohol or Drug Use Treatment; Treatment for Depression or other mood problem; Medication(s); Social Support   Have You Recently Had Any Thoughts About Hurting Yourself? Yes  Are You Planning to Commit Suicide/Harm Yourself At This time? No   Flowsheet Row ED from 01/09/2024 in [facility] Video Visit from 11/06/2020 in [facility] [place]  C-SSRS RISK CATEGORY Moderate Risk No Risk    Have you Recently Had Thoughts About Hurting Someone [name]? No  Are You Planning to Harm Someone at This Time? No  Explanation: N/A   Have You Used Any Alcohol or Drugs in the Past 24 Hours? Yes  How Long Ago Did You Use Drugs or Alcohol? Yesterday - he thinks, per son's report to him What Did You Use and How Much? I couldn't tell you, I honestly don't know, I can't remember   Do You Currently Have a Therapist/Psychiatrist? Yes  Name of Therapist/Psychiatrist: Name of Therapist/Psychiatrist: Patient sees Manuel Kelly with [facility] in [place] for med management   Have You Been Recently Discharged From Any Office Practice or Programs? No  Explanation of Discharge From Practice/Program: N/A    CCA Screening Triage Referral Assessment Type of Contact: Face-to-Face  Telemedicine Service Delivery:   Is this Initial or Reassessment?   Date Telepsych consult ordered in [facility]:    Time Telepsych consult ordered in [facility]:    Location of Assessment: [facility]  Provider Location: [facility] Assessment Services   Collateral Involvement: None   Does Patient Have a Automotive Engineer  Guardian? No  Legal Guardian Contact Information: N/A  Copy of Legal Guardianship Form: -- (N/A)  Legal Guardian Notified of Arrival: -- (N/A)  Legal Guardian Notified of Pending Discharge: -- (N/A)  If Minor and Not Living with Parent(s), Who has Custody? N/A  Is CPS involved or ever been involved? Never  Is APS involved or ever been involved? Never   Patient Determined To Be At Risk for Harm To Self or Others Based on Review of Patient Reported Information or Presenting Complaint? Yes, for Self-Harm  Method: -- (N/A, no HI)  Availability of Means: -- (N/A, no HI)  Intent: -- (N/A, no HI)  Notification Required: -- (N/A, no HI)  Additional Information for Danger to Others Potential: -- (N/A, no HI)  Additional Comments for Danger to Others Potential: N/A, no HI  Are There Guns or Other Weapons in Your Home? No  Types of Guns/Weapons: N/A  Are These Weapons Safely  Secured?                            -- (N/A)  Who Could Verify You Are Able To Have These Secured: N/A  Do You Have any Outstanding Charges, Pending Court Dates, Parole/Probation? Denies  Contacted To Inform of Risk of Harm To Self or Others: Family/Significant Other:    Does Patient Present under Involuntary Commitment? No    [place] of Residence: [place]   Patient Currently Receiving the Following Services: Medication Management   Determination of Need: Urgent (48 hours)   Options For Referral: Outpatient Therapy; Medication Management; Intensive Outpatient Therapy; Inpatient Hospitalization; Facility-Based Crisis; BH Urgent Care     CCA Biopsychosocial Patient Reported Schizophrenia/Schizoaffective Diagnosis in Past: No   Strengths: Patient is seeking treatment today.  He has family support.   Mental Health Symptoms Depression:  Difficulty Concentrating; Hopelessness; Worthlessness; Sleep (too much or little)   Duration of Depressive symptoms: Duration of Depressive Symptoms:  Greater than two weeks   Mania:  None   Anxiety:   Worrying; Tension   Psychosis:  None   Duration of Psychotic symptoms:    Trauma:  None   Obsessions:  None   Compulsions:  None   Inattention:  N/A   Hyperactivity/Impulsivity:  N/A   Oppositional/Defiant Behaviors:  N/A   Emotional Irregularity:  None   Other Mood/Personality Symptoms:  Worsening depression recently, reported to provider yesterday that it is likely related to upcoming anniversay of the death of his gf.    Mental Status Exam Appearance and self-care  Stature:  Average   Weight:  Average weight   Clothing:  Casual   Grooming:  Normal   Cosmetic use:  None   Posture/gait:  Normal   Motor activity:  Not Remarkable   Sensorium  Attention:  Confused (somewhat disorganized)   Concentration:  Scattered   Orientation:  Object; Place; Person; Time   Recall/memory:  Defective in Short-term   Affect and Mood  Affect:  Blunted; Depressed   Mood:  Depressed   Relating  Eye contact:  Fleeting   Facial expression:  Depressed; Responsive   Attitude toward examiner:  Cooperative   Thought and Language  Speech flow: Slow; Paucity   Thought content:  Appropriate to Mood and Circumstances   Preoccupation:  None   Hallucinations:  None   Organization:  Goal-directed; Loose   Company Secretary of Knowledge:  Average   Intelligence:  Average   Abstraction:  Functional   Judgement:  Impaired   Reality Testing:  Adequate   Insight:  Gaps   Decision Making:  Confused; Vacilates   Social Functioning  Social Maturity:  Impulsive   Social Judgement:  Normal; Heedless   Stress  Stressors:  Grief/losses; Transitions   Coping Ability:  Exhausted   Skill Deficits:  Interpersonal; Self-control   Supports:  Family     Religion: Religion/Spirituality Are You A Religious Person?: Yes What is Your Religious Affiliation?:  (NA) How Might This Affect Treatment?:  NA  Leisure/Recreation: Leisure / Recreation Do You Have Hobbies?: No  Exercise/Diet: Exercise/Diet Do You Exercise?: No Have You Gained or Lost A Significant Amount of Weight in the Past Six Months?: No Do You Follow a Special Diet?: No Do You Have Any Trouble Sleeping?: Yes Explanation of Sleeping Difficulties: restless   CCA Employment/Education Employment/Work Situation: Employment / Work Situation Employment Situation: On disability Why is Patient on Disability: NA How  Long has Patient Been on Disability: Unknown Patient's Job has Been Impacted by Current Illness:  (N/A) Has Patient ever Been in the [employer]?: No  Education: Education Is Patient Currently Attending School?: No Last Grade Completed: 12 Did You Attend College?: No Did You Have An Individualized Education Program (IIEP): No Did You Have Any Difficulty At School?: No Patient's Education Has Been Impacted by Current Illness: No   CCA Family/Childhood History Family and Relationship History: Family history Marital status: Single Does patient have children?: Yes How many children?: 1 How is patient's relationship with their children?: Good relationship with son and son's 74 y.o. daughter  Childhood History:  Childhood History By whom was/is the patient raised?: Mother, Father Did patient suffer any verbal/emotional/physical/sexual abuse as a child?: No Did patient suffer from severe childhood neglect?: No Has patient ever been sexually abused/assaulted/raped as an adolescent or adult?: No Was the patient ever a victim of a crime or a disaster?: No Witnessed domestic violence?: No Has patient been affected by domestic violence as an adult?: No       CCA Substance Use Alcohol/Drug Use: Alcohol / Drug Use Pain Medications: See MAR Prescriptions: See MAR Over the Counter: See MAR History of alcohol / drug use?: Yes Longest period of sobriety (when/how long): 4 mos while in jail 10/2021 to  02/2022 Negative Consequences of Use: Legal, Personal relationships Withdrawal Symptoms: None Substance #1 Name of Substance 1: Cocaine 1 - Age of First Use: 19 1 - Amount (size/oz): uncertain 1 - Frequency: once per month 1 - Duration: on and off use since 02/2022 1 - Last Use / Amount: unclear, yesterday son found him with crack pipe at his propertin in [place].  He does not recall using. 1 - Method of Aquiring: unknown 1- Route of Use: NA                       ASAM's:  Six Dimensions of Multidimensional Assessment  Dimension 1:  Acute Intoxication and/or Withdrawal Potential:   Dimension 1:  Description of individual's past and current experiences of substance use and withdrawal: Some signs of intoxication, elevated mood, pressured speech, some disorganized thought process.  Dimension 2:  Biomedical Conditions and Complications:   Dimension 2:  Description of patient's biomedical conditions and  complications: No reported concerns  Dimension 3:  Emotional, Behavioral, or Cognitive Conditions and Complications:  Dimension 3:  Description of emotional, behavioral, or cognitive conditions and complications: In treatment for MDD, ADHD and GAD  Dimension 4:  Readiness to Change:  Dimension 4:  Description of Readiness to Change criteria: requesting rehab  Dimension 5:  Relapse, Continued use, or Continued Problem Potential:  Dimension 5:  Relapse, continued use, or continued problem potential critiera description: long hx of use with minimal clean time  Dimension 6:  Recovery/Living Environment:  Dimension 6:  Recovery/Iiving environment criteria description: Son is supportive  ASAM Severity Score: ASAM's Severity Rating Score: 4  ASAM Recommended Level of Treatment: ASAM Recommended Level of Treatment: Level III Residential Treatment   Substance use Disorder (SUD) Substance Use Disorder (SUD)  Checklist Symptoms of Substance Use: Presence of craving or strong urge to use, Large  amounts of time spent to obtain, use or recover from the substance(s)  Recommendations for Services/Supports/Treatments: Recommendations for Services/Supports/Treatments Recommendations For Services/Supports/Treatments: CD-IOP Intensive Chemical Dependency Program, Facility Based Crisis, Inpatient Hospitalization (Residential [facility] Tx)  Disposition Recommendation per psychiatric provider: Disposition pending provider eval.   DSM5 Diagnoses: Patient Active  Problem List   Diagnosis Date Noted   Generalized anxiety disorder 11/27/2013     Referrals to Alternative Service(s): Referred to Alternative Service(s):   Place:   Date:   Time:    Referred to Alternative Service(s):   Place:   Date:   Time:    Referred to Alternative Service(s):   Place:   Date:   Time:    Referred to Alternative Service(s):   Place:   Date:   Time:     Deland LITTIE Louder, [facility]

## 2024-01-09 NOTE — ED Notes (Signed)
 Pt provided with meal, snack, and water. He is animated, joking with peers, no acute distress reported. Respirations even and unlabored. Continue to monitor for safety.

## 2024-01-10 ENCOUNTER — Encounter (HOSPITAL_COMMUNITY): Payer: Self-pay

## 2024-01-10 ENCOUNTER — Other Ambulatory Visit (HOSPITAL_COMMUNITY)
Admission: EM | Admit: 2024-01-10 | Discharge: 2024-01-12 | Disposition: A | Discharge status: Other Acute Inpatient Hospital | Attending: Psychiatry | Admitting: Psychiatry

## 2024-01-10 DIAGNOSIS — F411 Generalized anxiety disorder: Secondary | ICD-10-CM | POA: Insufficient documentation

## 2024-01-10 DIAGNOSIS — F32A Depression, unspecified: Secondary | ICD-10-CM | POA: Insufficient documentation

## 2024-01-10 DIAGNOSIS — F1410 Cocaine abuse, uncomplicated: Secondary | ICD-10-CM | POA: Insufficient documentation

## 2024-01-10 DIAGNOSIS — F1590 Other stimulant use, unspecified, uncomplicated: Secondary | ICD-10-CM | POA: Diagnosis present

## 2024-01-10 DIAGNOSIS — F1910 Other psychoactive substance abuse, uncomplicated: Secondary | ICD-10-CM | POA: Diagnosis not present

## 2024-01-10 DIAGNOSIS — F909 Attention-deficit hyperactivity disorder, unspecified type: Secondary | ICD-10-CM | POA: Insufficient documentation

## 2024-01-10 DIAGNOSIS — F332 Major depressive disorder, recurrent severe without psychotic features: Secondary | ICD-10-CM | POA: Diagnosis not present

## 2024-01-10 MED ORDER — LOPERAMIDE HCL 2 MG PO CAPS: 2.0000 mg | ORAL_CAPSULE | ORAL | Status: DC | PRN

## 2024-01-10 MED ORDER — ONDANSETRON 4 MG PO TBDP: 4.0000 mg | ORAL_TABLET | Freq: Four times a day (QID) | ORAL | Status: DC | PRN

## 2024-01-10 MED ORDER — FLUTICASONE PROPIONATE 50 MCG/ACT NA SUSP: 2.0000 | Freq: Every day | NASAL | Status: DC | PRN

## 2024-01-10 MED ORDER — HALOPERIDOL LACTATE 5 MG/ML IJ SOLN: 10.0000 mg | Freq: Three times a day (TID) | INTRAMUSCULAR | Status: DC | PRN

## 2024-01-10 MED ORDER — DIPHENHYDRAMINE HCL 50 MG PO CAPS: 50.0000 mg | ORAL_CAPSULE | Freq: Three times a day (TID) | ORAL | Status: DC | PRN

## 2024-01-10 MED ORDER — LORAZEPAM 2 MG/ML IJ SOLN: 2.0000 mg | Freq: Three times a day (TID) | INTRAMUSCULAR | Status: DC | PRN

## 2024-01-10 MED ORDER — TRAZODONE HCL 50 MG PO TABS
50.0000 mg | ORAL_TABLET | Freq: Every evening | ORAL | Status: DC | PRN
  Administered 2024-01-10 – 2024-01-11 (×2): 50 mg via ORAL
  Filled 2024-01-10 (×3): qty 1

## 2024-01-10 MED ORDER — LORAZEPAM 1 MG PO TABS: 1.0000 mg | ORAL_TABLET | Freq: Four times a day (QID) | ORAL | Status: DC | PRN

## 2024-01-10 MED ORDER — LORAZEPAM 1 MG PO TABS
1.0000 mg | ORAL_TABLET | Freq: Three times a day (TID) | ORAL | Status: AC
  Administered 2024-01-11 (×3): 1 mg via ORAL
  Filled 2024-01-10 (×3): qty 1

## 2024-01-10 MED ORDER — ACETAMINOPHEN 325 MG PO TABS
650.0000 mg | ORAL_TABLET | Freq: Four times a day (QID) | ORAL | Status: DC | PRN
  Administered 2024-01-11: 650 mg via ORAL
  Filled 2024-01-10: qty 2

## 2024-01-10 MED ORDER — HALOPERIDOL 5 MG PO TABS: 5.0000 mg | ORAL_TABLET | Freq: Three times a day (TID) | ORAL | Status: DC | PRN

## 2024-01-10 MED ORDER — DIPHENHYDRAMINE HCL 50 MG/ML IJ SOLN: 50.0000 mg | Freq: Three times a day (TID) | INTRAMUSCULAR | Status: DC | PRN

## 2024-01-10 MED ORDER — BUPROPION HCL ER (XL) 150 MG PO TB24
150.0000 mg | ORAL_TABLET | Freq: Every day | ORAL | Status: DC
  Administered 2024-01-11: 150 mg via ORAL
  Filled 2024-01-10: qty 1

## 2024-01-10 MED ORDER — LORAZEPAM 1 MG PO TABS: 1.0000 mg | ORAL_TABLET | Freq: Three times a day (TID) | ORAL | Status: DC

## 2024-01-10 MED ORDER — LORAZEPAM 1 MG PO TABS: 1.0000 mg | ORAL_TABLET | Freq: Every day | ORAL | Status: DC

## 2024-01-10 MED ORDER — THIAMINE MONONITRATE 100 MG PO TABS: 100.0000 mg | ORAL_TABLET | Freq: Every day | ORAL | Status: DC

## 2024-01-10 MED ORDER — ADULT MULTIVITAMIN W/MINERALS CH
1.0000 | ORAL_TABLET | Freq: Every day | ORAL | Status: DC
  Administered 2024-01-10: 1 via ORAL
  Filled 2024-01-10: qty 1

## 2024-01-10 MED ORDER — MAGNESIUM HYDROXIDE 400 MG/5ML PO SUSP: 30.0000 mL | Freq: Every day | ORAL | Status: DC | PRN

## 2024-01-10 MED ORDER — LORAZEPAM 1 MG PO TABS
1.0000 mg | ORAL_TABLET | Freq: Four times a day (QID) | ORAL | Status: DC
  Administered 2024-01-10: 1 mg via ORAL
  Filled 2024-01-10: qty 1

## 2024-01-10 MED ORDER — THIAMINE MONONITRATE 100 MG PO TABS
100.0000 mg | ORAL_TABLET | Freq: Every day | ORAL | Status: DC
  Administered 2024-01-11: 100 mg via ORAL
  Filled 2024-01-10: qty 1

## 2024-01-10 MED ORDER — HYDROXYZINE HCL 25 MG PO TABS
25.0000 mg | ORAL_TABLET | Freq: Four times a day (QID) | ORAL | Status: DC | PRN
  Administered 2024-01-11: 25 mg via ORAL
  Filled 2024-01-10: qty 1

## 2024-01-10 MED ORDER — HALOPERIDOL LACTATE 5 MG/ML IJ SOLN: 5.0000 mg | Freq: Three times a day (TID) | INTRAMUSCULAR | Status: DC | PRN

## 2024-01-10 MED ORDER — LORAZEPAM 1 MG PO TABS: 1.0000 mg | ORAL_TABLET | Freq: Two times a day (BID) | ORAL | Status: DC

## 2024-01-10 MED ORDER — THIAMINE HCL 100 MG/ML IJ SOLN
100.0000 mg | Freq: Once | INTRAMUSCULAR | Status: AC
  Administered 2024-01-10: 100 mg via INTRAMUSCULAR
  Filled 2024-01-10: qty 2

## 2024-01-10 MED ORDER — HYDROXYZINE HCL 25 MG PO TABS: 25.0000 mg | ORAL_TABLET | Freq: Four times a day (QID) | ORAL | Status: DC | PRN

## 2024-01-10 MED ORDER — ALUM & MAG HYDROXIDE-SIMETH 200-200-20 MG/5ML PO SUSP: 30.0000 mL | ORAL | Status: DC | PRN

## 2024-01-10 MED ORDER — LORAZEPAM 1 MG PO TABS
1.0000 mg | ORAL_TABLET | Freq: Four times a day (QID) | ORAL | Status: AC
  Administered 2024-01-10 (×3): 1 mg via ORAL
  Filled 2024-01-10 (×3): qty 1

## 2024-01-10 MED ORDER — BUPROPION HCL ER (XL) 150 MG PO TB24
150.0000 mg | ORAL_TABLET | ORAL | Status: DC
  Administered 2024-01-10: 150 mg via ORAL
  Filled 2024-01-10: qty 1

## 2024-01-10 MED ORDER — ADULT MULTIVITAMIN W/MINERALS CH
1.0000 | ORAL_TABLET | Freq: Every day | ORAL | Status: DC
  Administered 2024-01-11: 1 via ORAL
  Filled 2024-01-10: qty 1

## 2024-01-10 NOTE — ED Notes (Addendum)
 Patient is currently lying on recliner, patient has complaint of back pain, patient denies S/I, H/I, AVH, patient will be given Tylenol for back pain and will continue to monitor patient for safety.

## 2024-01-10 NOTE — ED Provider Notes (Signed)
 [facility] Continuous Assessment Admission H&P  Date: 01/10/24 Patient Name: Manuel Kelly MRN: [id number] Chief Complaint: Cocaine use disorder  Diagnoses:  Final diagnoses:  Substance abuse (HCC)  Cocaine abuse [facility])    HPI: Manuel Kelly, prefers to be called Manuel Kelly, is a 53 year old male who presented to [facility] behavioral health on 01/09/2024 voluntarily.  Patient is assessed by this nurse practitioner face-to-face.  Patient is seated in observation area, no apparent distress.  Patient is alert and oriented, pleasant and cooperative during assessment.    Patient states I came here because I am ready to stop using drugs, I want to detox and then go to inpatient.  I used cocaine all the time.  I use it 1 day and then that turns into 6 days or even more.  I do not stop until my son comes to find me.  I need to be detoxed.  Endorses several previous residential substance use treatment completions.  Most recently graduated [facility] approximately 1 year ago.  Patient states I used the day I left [facility].  Manuel Kelly endorses cocaine use.  Most recent cocaine use approximately 1 week ago.  He uses varying amounts of cocaine via smoking.  Patient states I want to come off of everything.  Current prior to admission scheduled medications include Adderall, Ambien  and Xanax .  Patient is followed at [facility] [place] for medication management, Dr. Okey.  History includes major depressive disorder, generalized anxiety disorder and ADHD.  Patient denies history of inpatient psychiatric hospitalization.  Manuel Kelly denies suicidal and homicidal ideation.  He endorses 1 previous suicide attempt, age 65 he slit his wrists.  He denies auditory and visual hallucinations.  There is no evidence of delusional thought content no indication that patient is responding to internal stimuli.  Patient resides in [place] with his son and son's family.  He denies access to weapons.  He receives  disability income.  He endorses average appetite.  Patient offered support and encouragement.  He would like to be admitted to [facility] behavioral health facility based crisis while awaiting residential substance use treatment.  Reviewed medications including lorazepam, discussed potential side effects and offered patient opportunity to ask questions.  Total Time spent with patient: 45 minutes  Musculoskeletal  Strength & Muscle Tone: within normal limits Gait & Station: normal Patient leans: N/A  Psychiatric Specialty Exam  Presentation General Appearance:  Appropriate for Environment; Casual  Eye Contact: Good  Speech: Clear and Coherent; Normal Rate  Speech Volume: Normal  Handedness: Right   Mood and Affect  Mood: Euthymic  Affect: Congruent; Appropriate   Thought Process  Thought Processes: Coherent; Goal Directed; Linear  Descriptions of Associations:Intact  Orientation:Full (Time, Place and Person)  Thought Content:Logical; WDL  Diagnosis of Schizophrenia or Schizoaffective disorder in past: No   Hallucinations:Hallucinations: None  Ideas of Reference:None  Suicidal Thoughts:Suicidal Thoughts: No  Homicidal Thoughts:Homicidal Thoughts: No   Sensorium  Memory: Immediate Good; Recent Fair  Judgment: Fair  Insight: Fair   Art Therapist  Concentration: Good  Attention Span: Good  Recall: Good  Fund of Knowledge: Good  Language: Good   Psychomotor Activity  Psychomotor Activity: Psychomotor Activity: Normal   Assets  Assets: Communication Skills; Desire for Improvement; Financial Resources/Insurance; Housing; Physical Health; Resilience; Social Support   Sleep  Sleep: Sleep: Fair Number of Hours of Sleep: 7   Nutritional Assessment (For OBS and [facility] admissions only) Has the patient had a weight loss or gain of 10 pounds  or more in the last 3 months?: No Has the patient had a decrease in food intake/or  appetite?: No Does the patient have dental problems?: No Does the patient have eating habits or behaviors that may be indicators of an eating disorder including binging or inducing vomiting?: No Has the patient recently lost weight without trying?: 0 Has the patient been eating poorly because of a decreased appetite?: 0 Malnutrition Screening Tool Score: 0    Physical Exam Vitals and nursing note reviewed.  Constitutional:      Appearance: Normal appearance. He is well-developed.  HENT:     Head: Normocephalic.     Nose: Nose normal.  Cardiovascular:     Rate and Rhythm: Normal rate and regular rhythm.     Heart sounds: Normal heart sounds.  Pulmonary:     Effort: Pulmonary effort is normal.     Breath sounds: Normal breath sounds.  Musculoskeletal:        General: Normal range of motion.     Cervical back: Normal range of motion.  Skin:    General: Skin is warm and dry.  Neurological:     Mental Status: He is alert and oriented to person, place, and time.  Psychiatric:        Attention and Perception: Attention and perception normal.        Mood and Affect: Mood and affect normal.        Speech: Speech normal.        Behavior: Behavior normal. Behavior is cooperative.        Thought Content: Thought content normal.        Cognition and Memory: Cognition and memory normal.    Review of Systems  Constitutional: Negative.   HENT: Negative.    Eyes: Negative.   Respiratory: Negative.    Cardiovascular: Negative.   Gastrointestinal: Negative.   Genitourinary: Negative.   Musculoskeletal: Negative.   Skin: Negative.   Neurological: Negative.   Psychiatric/Behavioral:  Positive for substance abuse.     Blood pressure 120/75, pulse 64, temperature 98.6 F (37 C), temperature source Oral, resp. rate 19, SpO2 100%. There is no height or weight on file to calculate BMI.  Past Psychiatric History: Generalized anxiety disorder, major depressive disorder, recurrent, severe,  attention deficit hyperactivity disorder, substance abuse  Is the patient at risk to self? No  Has the patient been a risk to self in the past 6 months? No .    Has the patient been a risk to self within the distant past? Yes   Is the patient a risk to others? No   Has the patient been a risk to others in the past 6 months? No   Has the patient been a risk to others within the distant past? No   Past Medical History: Bulging lumbar disc, degenerative arthritis, GERD, hypoglycemia  Family History: None reported  Social History: Resides with family, receives income, frequent cocaine use  Last Labs:  Admission on 01/09/2024  Component Date Value Ref Range Status   WBC 01/09/2024 12.6 (H)  4.0 - 10.5 K/uL Final   RBC 01/09/2024 4.61  4.22 - 5.81 MIL/uL Final   Hemoglobin 01/09/2024 14.6  13.0 - 17.0 g/dL Final   HCT 88/81/7974 41.4  39.0 - 52.0 % Final   MCV 01/09/2024 89.8  80.0 - 100.0 fL Final   MCH 01/09/2024 31.7  26.0 - 34.0 pg Final   MCHC 01/09/2024 35.3  30.0 - 36.0 g/dL Final   RDW  01/09/2024 12.7  11.5 - 15.5 % Final   Platelets 01/09/2024 274  150 - 400 K/uL Final   nRBC 01/09/2024 0.0  0.0 - 0.2 % Final   Neutrophils Relative % 01/09/2024 55  % Final   Neutro Abs 01/09/2024 6.9  1.7 - 7.7 K/uL Final   Lymphocytes Relative 01/09/2024 38  % Final   Lymphs Abs 01/09/2024 4.8 (H)  0.7 - 4.0 K/uL Final   Monocytes Relative 01/09/2024 3  % Final   Monocytes Absolute 01/09/2024 0.4  0.1 - 1.0 K/uL Final   Eosinophils Relative 01/09/2024 4  % Final   Eosinophils Absolute 01/09/2024 0.5  0.0 - 0.5 K/uL Final   Basophils Relative 01/09/2024 0  % Final   Basophils Absolute 01/09/2024 0.0  0.0 - 0.1 K/uL Final   WBC Morphology 01/09/2024 See Note   Final    Morphology unremarkable   RBC Morphology 01/09/2024 MORPHOLOGY UNREMARKABLE   Final    Morphology unremarkable   Smear Review 01/09/2024 See Note   Final   Comment:  Normal Platelet Morphology Performed at [facility], [address], [place], [place] [address]    Sodium 01/09/2024 138  135 - 145 mmol/L Final   Potassium 01/09/2024 4.0  3.5 - 5.1 mmol/L Final   Chloride 01/09/2024 104  98 - 111 mmol/L Final   CO2 01/09/2024 20 (L)  22 - 32 mmol/L Final   Glucose, Bld 01/09/2024 78  70 - 99 mg/dL Final   Glucose reference range applies only to samples taken after fasting for at least 8 hours.   BUN 01/09/2024 34 (H)  6 - 20 mg/dL Final   Creatinine, Ser 01/09/2024 1.25 (H)  0.61 - 1.24 mg/dL Final   Calcium 88/81/7974 9.8  8.9 - 10.3 mg/dL Final   Total Protein 88/81/7974 7.0  6.5 - 8.1 g/dL Final   Albumin 88/81/7974 3.8  3.5 - 5.0 g/dL Final   AST 88/81/7974 21  15 - 41 U/L Final   ALT 01/09/2024 21  0 - 44 U/L Final   Alkaline Phosphatase 01/09/2024 69  38 - 126 U/L Final   Total Bilirubin 01/09/2024 0.8  0.0 - 1.2 mg/dL Final   GFR, Estimated 01/09/2024 >60  >60 mL/min Final   Comment: (NOTE) Calculated using the CKD-EPI Creatinine Equation (2021)    Anion gap 01/09/2024 14  5 - 15 Final   Performed at [facility], [address] [place], [place] [address]   Alcohol, Ethyl (B) 01/09/2024 <15  <15 mg/dL Final   Comment: (NOTE) For medical purposes only. Performed at [facility], [address] [place], [place] [address]    TSH 01/09/2024 3.508  0.350 - 4.500 uIU/mL Final   Comment: Performed by a 3rd Generation assay with a functional sensitivity of <=0.01 uIU/mL. Performed at [facility], [address], [place], [place] [address]    POC Amphetamine  UR 01/09/2024 None Detected  NONE DETECTED (Cut Off Level 1000 ng/mL) Final   POC Secobarbital (BAR) 01/09/2024 None Detected  NONE DETECTED (Cut Off Level 300 ng/mL) Final   POC Buprenorphine (BUP) 01/09/2024 None Detected  NONE DETECTED (Cut Off Level 10 ng/mL) Final   POC Oxazepam (BZO) 01/09/2024 None Detected  NONE DETECTED (Cut Off Level 300 ng/mL) Final   POC Cocaine UR 01/09/2024 Positive (A)  NONE DETECTED  (Cut Off Level 300 ng/mL) Final   POC Methamphetamine UR 01/09/2024 None Detected  NONE DETECTED (Cut Off Level  1000 ng/mL) Final   POC Morphine 01/09/2024 None Detected  NONE DETECTED (Cut Off Level 300 ng/mL) Final   POC Methadone UR 01/09/2024 None Detected  NONE DETECTED (Cut Off Level 300 ng/mL) Final   POC Oxycodone UR 01/09/2024 None Detected  NONE DETECTED (Cut Off Level 100 ng/mL) Final   POC Marijuana UR 01/09/2024 None Detected  NONE DETECTED (Cut Off Level 50 ng/mL) Final    Allergies: Iodinated contrast media, Talwin [pentazocine], and Other  Medications:  Facility Ordered Medications  Medication   acetaminophen (TYLENOL) tablet 650 mg   alum & mag hydroxide-simeth (MAALOX/MYLANTA) 200-200-20 MG/5ML suspension 30 mL   magnesium hydroxide (MILK OF MAGNESIA) suspension 30 mL   haloperidol (HALDOL) tablet 5 mg   And   diphenhydrAMINE (BENADRYL) capsule 50 mg   haloperidol lactate (HALDOL) injection 5 mg   And   diphenhydrAMINE (BENADRYL) injection 50 mg   And   LORazepam (ATIVAN) injection 2 mg   haloperidol lactate (HALDOL) injection 10 mg   And   diphenhydrAMINE (BENADRYL) injection 50 mg   And   LORazepam (ATIVAN) injection 2 mg   traZODone  (DESYREL ) tablet 50 mg   buPROPion  (WELLBUTRIN  XL) 24 hr tablet 150 mg   fluticasone (FLONASE) 50 MCG/ACT nasal spray 2 spray   thiamine (VITAMIN B1) injection 100 mg   [START ON 01/11/2024] thiamine (VITAMIN B1) tablet 100 mg   multivitamin with minerals tablet 1 tablet   LORazepam (ATIVAN) tablet 1 mg   hydrOXYzine  (ATARAX ) tablet 25 mg   loperamide (IMODIUM) capsule 2-4 mg   ondansetron (ZOFRAN-ODT) disintegrating tablet 4 mg   LORazepam (ATIVAN) tablet 1 mg   Followed by   NOREEN ON 01/11/2024] LORazepam (ATIVAN) tablet 1 mg   Followed by   NOREEN ON 01/12/2024] LORazepam (ATIVAN) tablet 1 mg   Followed by   NOREEN ON 01/13/2024] LORazepam (ATIVAN) tablet 1 mg   PTA Medications  Medication Sig   fluticasone  (FLONASE) 50 MCG/ACT nasal spray Place 2 sprays into both nostrils daily as needed for allergies or rhinitis.   QUEtiapine (SEROQUEL) 50 MG tablet Take 1 tablet (50 mg total) by mouth at bedtime.   buPROPion  (WELLBUTRIN  XL) 150 MG 24 hr tablet Take 1 tablet (150 mg total) by mouth every morning.   ALPRAZolam  (XANAX ) 1 MG tablet TAKE 1 TABLET BY MOUTH FOUR TIMES DAILY (1 IN THE MORNING, 1 AT NOON, 1 IN THE EVENING, AND 1 AT BEDTIME) (Patient taking differently: Take 1 mg by mouth in the morning, at noon, in the evening, and at bedtime.)   methylphenidate  (RITALIN ) 20 MG tablet Take 1 tablet (20 mg total) by mouth 3 (three) times daily with meals.   traZODone  (DESYREL ) 100 MG tablet Take 200 mg by mouth at bedtime.   zolpidem  (AMBIEN ) 10 MG tablet Take 10 mg by mouth at bedtime.   ibuprofen (ADVIL) 200 MG tablet Take 400-600 mg by mouth every 6 (six) hours as needed (For headache or pain).      Medical Decision Making  Patient remains voluntary.  Patient will be admitted to [facility] behavioral health facility based crisis unit.  Patient verbalizes plan to transition to residential substance use treatment facility.  01/09/2024: -EKG: QTc measures 419 ms. -CBC: Mildly elevated WBC 12.6.Lymphs Abs 4.8. -BAL: Negative. -Tox: + Cocaine       Recommendations  Based on my evaluation the patient does not appear to have an emergency medical condition.  Ellouise LITTIE Dawn, FNP 01/10/24  10:29 AM

## 2024-01-10 NOTE — ED Notes (Signed)
 Pt reported chronic lower back pain, prn tylenol given w/ prn trazodone ; effective. Pt denies SI/HI/AVH at this time. Pt is in a silly, playful mood this shift, joking and laughing with peers. No acute distress noted. Respirations even and unlabored. Continue to monitor for safety.

## 2024-01-10 NOTE — ED Notes (Signed)
 Pt sleeping in bed. No acute distress noted. Respirations even and unlabored. Continue to monitor for safety.

## 2024-01-10 NOTE — Group Note (Signed)
 Group Topic: Positive Affirmations  Group Date: 01/10/2024 Start Time: 1300 End Time: 1326 Facilitators: Veverly Oddis BRAVO, NT  Department: [facility]  Number of Participants: 6  Group Focus: Positive Traits  Treatment Modality:  Cognitive Behavioral Therapy Interventions utilized were orientation Purpose: regain self-worth  Name: Manuel Kelly Date of Birth: 1970-11-30  MR: [id number]    Level of Participation: moderate Quality of Participation: cooperative Interactions with others: gave feedback Mood/Affect: appropriate Triggers (if applicable): none Cognition: coherent/clear Progress: Moderate Response: I am determined, focused and kind. Plan: patient will be encouraged to think of other positive traits as well and encouraged to keep attending group.  Patients Problems:  Patient Active Problem List   Diagnosis Date Noted   Stimulant use disorder 01/10/2024   Generalized anxiety disorder 11/27/2013

## 2024-01-10 NOTE — ED Notes (Signed)
 Pt asleep at the moment. NAD, CIWA assessment unable to be completed. Will keep monitoring for safety.

## 2024-01-10 NOTE — Group Note (Signed)
 Group Topic: Social Support  Group Date: 01/10/2024 Start Time: 1330 End Time: 1400 Facilitators: Rhianon Zabawa, Zane HERO, RN  Department: [facility]  Number of Participants: 1  Group Focus: check in Treatment Modality:  Individual Therapy Interventions utilized were support Purpose: express feelings  Name: LENWARD ABLE Date of Birth: 1970-10-05  MR: [id number]    Level of Participation: active Quality of Participation: attentive and cooperative Interactions with others: gave feedback Mood/Affect: appropriate Triggers (if applicable): None identified at this time Cognition: coherent/clear and logical Progress: Gaining insight Response: Patient oriented to the unit, voices understanding of unit rules. All questions answered appropriately. No concerns voiced at this time.  Plan: patient will be encouraged to attend groups/programming on the unit  Patients Problems:  Patient Active Problem List   Diagnosis Date Noted   Stimulant use disorder 01/10/2024   Generalized anxiety disorder 11/27/2013

## 2024-01-10 NOTE — Group Note (Signed)
 Group Topic: Relapse and Recovery  Group Date: 01/10/2024 Start Time: 2000 End Time: 2100 Facilitators: Joan Plowman B  Department: [facility]  Number of Participants: 3  Group Focus: abuse issues, community group, and diagnosis education Treatment Modality:  Exposure Therapy, Patient-Centered Therapy, and Spiritual Interventions utilized were leisure development and support Purpose: enhance coping skills, relapse prevention strategies, and trigger / craving management  Name: MAURIZIO GENO Date of Birth: Mar 26, 1970  MR: [id number]    Level of Participation: PT DID NOT ATTEND GROUP  Patients Problems:  Patient Active Problem List   Diagnosis Date Noted   Stimulant use disorder 01/10/2024   Generalized anxiety disorder 11/27/2013

## 2024-01-10 NOTE — ED Notes (Signed)
 Paitent provided dinner.

## 2024-01-10 NOTE — ED Notes (Signed)
 Patient transferred from [facility] to [facility] requesting assistance in detox from cocaine. Calm, cooperative throughout interview process. Skin assessment completed. Oriented to unit. Meal and drink offered. Patient alert & oriented x4. Denies intent to harm self or others when asked. Denies A/VH. Patient denies any physical complaints when asked. No acute distress noted. Support and encouragement provided. Routine safety checks conducted per facility protocol. Encouraged patient to notify staff if any thoughts of harm towards self or others arise. Patient verbalizes understanding and agreement.

## 2024-01-10 NOTE — ED Notes (Signed)
Snack was given. 

## 2024-01-10 NOTE — Care Management (Signed)
 [name]...  Writer met with patient to discuss discharge planning...     [name], prefers to be called Manuel Kelly, is a 53 year old male who presented to [facility] behavioral health on 01/09/2024 voluntarily.   He would like to be admitted to [facility] behavioral health facility based crisis while awaiting residential substance use treatment.   Writer will refer patient to [facility]

## 2024-01-10 NOTE — ED Notes (Signed)
 Breakfast was provided at bedside

## 2024-01-10 NOTE — ED Notes (Signed)
 Patient is in the bedroom calm and composed. NAD. Environment secured. Denies SI/HI/AVH Respirations even and unlabored. Will monitor for safety.

## 2024-01-11 DIAGNOSIS — F1410 Cocaine abuse, uncomplicated: Secondary | ICD-10-CM

## 2024-01-11 MED ORDER — TRAZODONE HCL 50 MG PO TABS: 50.0000 mg | ORAL_TABLET | Freq: Every evening | ORAL | 0 refills | Status: DC | PRN

## 2024-01-11 MED ORDER — HYDROXYZINE HCL 25 MG PO TABS: 25.0000 mg | ORAL_TABLET | Freq: Four times a day (QID) | ORAL | 0 refills | Status: DC | PRN

## 2024-01-11 MED ORDER — BUPROPION HCL ER (XL) 150 MG PO TB24
150.0000 mg | ORAL_TABLET | Freq: Every day | ORAL | 0 refills | Status: AC
Start: 2024-01-12 — End: ?

## 2024-01-11 MED ORDER — NICOTINE 21 MG/24HR TD PT24
21.0000 mg | MEDICATED_PATCH | Freq: Once | TRANSDERMAL | Status: AC
  Administered 2024-01-11: 21 mg via TRANSDERMAL
  Filled 2024-01-11: qty 1

## 2024-01-11 MED ORDER — VITAMIN B-1 100 MG PO TABS: 100.0000 mg | ORAL_TABLET | Freq: Every day | ORAL | 0 refills | Status: AC

## 2024-01-11 MED ORDER — IBUPROFEN 600 MG PO TABS
600.0000 mg | ORAL_TABLET | Freq: Once | ORAL | Status: AC
  Administered 2024-01-11: 600 mg via ORAL
  Filled 2024-01-11: qty 1

## 2024-01-11 MED ORDER — NICOTINE 21 MG/24HR TD PT24: 21.0000 mg | MEDICATED_PATCH | Freq: Every day | TRANSDERMAL | 0 refills | Status: AC

## 2024-01-11 MED ORDER — ADULT MULTIVITAMIN W/MINERALS CH: 1.0000 | ORAL_TABLET | Freq: Every day | ORAL | 0 refills | Status: AC

## 2024-01-11 NOTE — Care Management (Signed)
 [facility] Care Management  Writer re faxed the referral to [facility] after they reported they did not receive the prior referral.

## 2024-01-11 NOTE — ED Notes (Addendum)
 Pt was in his room when writer arrived on the unit. During assessment, pt endorses tiredness, weakness and sick to the stomach. Gatorade will be administered to pt. Pt denies SI/HI/AVH. Q15 safety checks in place per facility policy.

## 2024-01-11 NOTE — ED Notes (Signed)
 Patient is in the bedroom calm and sleeping. NAD. Environment secured per policy. Will continue to monitor for safety.

## 2024-01-11 NOTE — ED Notes (Signed)
 Pt reports that atarax  was helpful with decreasing anxiety level. Pt continues to report level 5/10 headache. Provider notified.

## 2024-01-11 NOTE — ED Notes (Signed)
 Pt is upset we can't give sugar with his coffee

## 2024-01-11 NOTE — Group Note (Signed)
 Group Topic: Decisional Balance/Substance Abuse  Group Date: 01/11/2024 Start Time: 1515 End Time: 1600 Facilitators: Nicholaus Arlyne BIRCH, NT; Elnor Keven SAILOR  Department: [facility]  Number of Participants: 8  Group Focus: chemical dependency education Treatment Modality:  Psychoeducation Interventions utilized were support Purpose: relapse prevention strategies  Name: Manuel Kelly Date of Birth: 24-Jan-1971  MR: [id number]    Level of Participation: minimal Quality of Participation: attentive Interactions with others: listened Mood/Affect: appropriate Triggers (if applicable): NA Cognition: coherent/clear Progress: Minimal Response: Read through NA Am I an Addict questions and discussed experience of being an addict. Listened to testimony of recovery and supported and encouraged by staff to be successful and confident in recovery. Plan: follow-up needed  Patients Problems:  Patient Active Problem List   Diagnosis Date Noted   Stimulant use disorder 01/10/2024   Generalized anxiety disorder 11/27/2013

## 2024-01-11 NOTE — Care Management (Signed)
 [facility] Care Management  [facility] has accepted the Client into their program.  Client has a discharge date of 01/12/24.    The facility will contact us  with a pickup time for tomorrow.

## 2024-01-11 NOTE — Group Note (Signed)
 Group Topic: Change and Accountability  Group Date: 01/11/2024 Start Time: 2030 End Time: 2100 Facilitators: Anice Benton LABOR, NT  Department: [facility]  Number of Participants: 5  Group Focus: clarity of thought, communication, feeling awareness/expression, and goals/reality orientation Treatment Modality:  Individual Therapy Interventions utilized were assignment and reminiscence Purpose: explore maladaptive thinking and increase insight  Name: Manuel Kelly Date of Birth: 1970-12-01  MR: [id number]    Level of Participation: Did Not Attend  Quality of Participation: N/A Interactions with others: N/A Mood/Affect: N/A Triggers (if applicable): N/A Cognition: N/A Progress: None Response: N/A Plan: patient will be encouraged to attend groups   Patients Problems:  Patient Active Problem List   Diagnosis Date Noted   Stimulant use disorder 01/10/2024   Generalized anxiety disorder 11/27/2013

## 2024-01-11 NOTE — ED Provider Notes (Signed)
 FBC/OBS ASAP Discharge Summary  Date and Time: 01/11/2024 2:27 PM  Name: Manuel Kelly  MRN:  [id number]   Discharge Diagnoses:  Final diagnoses:  Cocaine use disorder [facility])    Subjective: Patient seen and evaluated face-to-face by this provider on 01/11/2024, chart reviewed and case discussed with Dr. Cole. On approach, patient states that he is doing well and is still living. He is noted to be alert and oriented x 4. His thought process is linear and goal oriented. Thought content is negative for SI/HI/AVH. Objectively, no signs of acute psychosis. He reports difficulty sleeping and states that he wakes up in the middle of the night thinking about finding his fiance on the floor dead and that states that 20-Mar-2024 will make 1 year since she died. He denies nightmares. He reports a good appetite. He describes his mood as feeling anxious and states that he worries all the time. He states that he is ready to go to residential treatment for his cocaine use. He denies drugs cravings at this time. He reports last taking Xanax , Adderall and Ambien  last Thursday, 01/04/24. He denies benzodiazepine withdrawal symptoms. He denies medication side effects. He denies physical complaints.   Stay Summary: Manuel Kelly, prefers to be called Manuel Kelly, is a 53 year old male who presented to [facility] on 01/09/2024 voluntarily requesting help to stop using drugs. He reported using cocaine all the time and reported using cocaine on average, 6 days/week. UDS was positive cocaine.   Current home medications include Adderall, Ambien  and Xanax . However, his UDS was not positive for benzodiazepines or amphetamines. Patient is followed at [facility] outpatient in [place] for medication management, and see Dr. Okey. History includes major depressive disorder, generalized anxiety disorder and ADHD. UDS positive for cocaine only. BAL was negative.     Total Time  spent with patient: 30 minutes   Past Psychiatric History: History of generalized anxiety disorder, major depressive disorder, attention deficit hyperactive disorder and substance abuse. He has established with Dr. Okey for medication management.   Past Medical History: History of bulging lumbar disc, degenerative arthritis, hypoglycemia and GERD.   Family History: No reported history.   Family Psychiatric  History: No reported history.   Social History: Patient resides in [place] with his son and his son's family.  He he is unemployed and receives disability income.  Tobacco Cessation:  A prescription for an FDA-approved tobacco cessation medication provided at discharge  Current Medications:  Current Facility-Administered Medications  Medication Dose Route Frequency Provider Last Rate Last Admin   acetaminophen  (TYLENOL ) tablet 650 mg  650 mg Oral Q6H PRN Dasie Ellouise CROME, FNP   650 mg at 01/11/24 1225   alum & mag hydroxide-simeth (MAALOX/MYLANTA) 200-200-20 MG/5ML suspension 30 mL  30 mL Oral Q4H PRN Dasie Ellouise CROME, FNP       buPROPion  (WELLBUTRIN  XL) 24 hr tablet 150 mg  150 mg Oral Daily Allen, Tina L, FNP   150 mg at 01/11/24 0920   haloperidol  (HALDOL ) tablet 5 mg  5 mg Oral TID PRN Dasie Ellouise CROME, FNP       And   diphenhydrAMINE  (BENADRYL ) capsule 50 mg  50 mg Oral TID PRN Allen, Tina L, FNP       haloperidol  lactate (HALDOL ) injection 5 mg  5 mg Intramuscular TID PRN Dasie Ellouise CROME, FNP       And   diphenhydrAMINE  (BENADRYL ) injection 50 mg  50 mg Intramuscular TID PRN  Dasie Ellouise CROME, FNP       And   LORazepam (ATIVAN) injection 2 mg  2 mg Intramuscular TID PRN Allen, Tina L, FNP       haloperidol lactate (HALDOL) injection 10 mg  10 mg Intramuscular TID PRN Allen, Tina L, FNP       And   diphenhydrAMINE (BENADRYL) injection 50 mg  50 mg Intramuscular TID PRN Allen, Tina L, FNP       And   LORazepam (ATIVAN) injection 2 mg  2 mg Intramuscular TID PRN Allen, Tina L, FNP        fluticasone (FLONASE) 50 MCG/ACT nasal spray 2 spray  2 spray Each Nare Daily PRN Dasie Ellouise CROME, FNP       hydrOXYzine  (ATARAX ) tablet 25 mg  25 mg Oral Q6H PRN Allen, Tina L, FNP   25 mg at 01/11/24 1226   loperamide (IMODIUM) capsule 2-4 mg  2-4 mg Oral PRN Dasie Ellouise CROME, FNP       LORazepam (ATIVAN) tablet 1 mg  1 mg Oral Q6H PRN Dasie Ellouise CROME, FNP       LORazepam (ATIVAN) tablet 1 mg  1 mg Oral TID Dasie Ellouise CROME, FNP   1 mg at 01/11/24 0919   Followed by   NOREEN ON 01/12/2024] LORazepam (ATIVAN) tablet 1 mg  1 mg Oral BID Dasie Ellouise CROME, FNP       Followed by   NOREEN ON 01/13/2024] LORazepam (ATIVAN) tablet 1 mg  1 mg Oral Daily Dasie Ellouise CROME, FNP       magnesium hydroxide (MILK OF MAGNESIA) suspension 30 mL  30 mL Oral Daily PRN Allen, Tina L, FNP       multivitamin with minerals tablet 1 tablet  1 tablet Oral Daily Dasie Ellouise CROME, FNP   1 tablet at 01/11/24 0920   nicotine (NICODERM CQ - dosed in mg/24 hours) patch 21 mg  21 mg Transdermal Once Jaydalee Bardwell L, NP   21 mg at 01/11/24 1126   ondansetron (ZOFRAN-ODT) disintegrating tablet 4 mg  4 mg Oral Q6H PRN Allen, Tina L, FNP       thiamine (VITAMIN B1) tablet 100 mg  100 mg Oral Daily Dasie Ellouise CROME, FNP   100 mg at 01/11/24 0920   traZODone  (DESYREL ) tablet 50 mg  50 mg Oral QHS PRN Allen, Tina L, FNP   50 mg at 01/10/24 2133   Current Outpatient Medications  Medication Sig Dispense Refill   [START ON 01/12/2024] buPROPion  (WELLBUTRIN  XL) 150 MG 24 hr tablet Take 1 tablet (150 mg total) by mouth daily. 30 tablet 0   nicotine (NICODERM CQ - DOSED IN MG/24 HOURS) 21 mg/24hr patch Place 1 patch (21 mg total) onto the skin daily. 30 patch 0   traZODone  (DESYREL ) 50 MG tablet Take 1 tablet (50 mg total) by mouth at bedtime as needed for sleep. 30 tablet 0    PTA Medications:  Facility Ordered Medications  Medication   [COMPLETED] thiamine (VITAMIN B1) injection 100 mg   acetaminophen (TYLENOL) tablet 650 mg   alum & mag  hydroxide-simeth (MAALOX/MYLANTA) 200-200-20 MG/5ML suspension 30 mL   buPROPion  (WELLBUTRIN  XL) 24 hr tablet 150 mg   fluticasone (FLONASE) 50 MCG/ACT nasal spray 2 spray   hydrOXYzine  (ATARAX ) tablet 25 mg   loperamide (IMODIUM) capsule 2-4 mg   LORazepam (ATIVAN) tablet 1 mg   [COMPLETED] LORazepam (ATIVAN) tablet 1 mg   Followed by   LORazepam (ATIVAN) tablet  1 mg   Followed by   NOREEN ON 01/12/2024] LORazepam (ATIVAN) tablet 1 mg   Followed by   NOREEN ON 01/13/2024] LORazepam (ATIVAN) tablet 1 mg   magnesium hydroxide (MILK OF MAGNESIA) suspension 30 mL   multivitamin with minerals tablet 1 tablet   ondansetron (ZOFRAN-ODT) disintegrating tablet 4 mg   thiamine (VITAMIN B1) tablet 100 mg   traZODone  (DESYREL ) tablet 50 mg   haloperidol (HALDOL) tablet 5 mg   And   diphenhydrAMINE (BENADRYL) capsule 50 mg   haloperidol lactate (HALDOL) injection 5 mg   And   diphenhydrAMINE (BENADRYL) injection 50 mg   And   LORazepam (ATIVAN) injection 2 mg   haloperidol lactate (HALDOL) injection 10 mg   And   diphenhydrAMINE (BENADRYL) injection 50 mg   And   LORazepam (ATIVAN) injection 2 mg   nicotine (NICODERM CQ - dosed in mg/24 hours) patch 21 mg   PTA Medications  Medication Sig   [START ON 01/12/2024] buPROPion  (WELLBUTRIN  XL) 150 MG 24 hr tablet Take 1 tablet (150 mg total) by mouth daily.   traZODone  (DESYREL ) 50 MG tablet Take 1 tablet (50 mg total) by mouth at bedtime as needed for sleep.   nicotine (NICODERM CQ - DOSED IN MG/24 HOURS) 21 mg/24hr patch Place 1 patch (21 mg total) onto the skin daily.       01/10/2024   12:55 PM 11/06/2020    9:47 AM  Depression screen PHQ 2/9  Decreased Interest 1 0  Down, Depressed, Hopeless 1 0  PHQ - 2 Score 2 0  Altered sleeping 2   Tired, decreased energy 3   Change in appetite 0   Feeling bad or failure about yourself  1   Trouble concentrating 3   Moving slowly or fidgety/restless 1   Suicidal thoughts 0   PHQ-9 Score  12   Difficult doing work/chores Not difficult at all     Flowsheet Row ED from 01/10/2024 in [facility] from 01/09/2024 in [facility] Video Visit from 11/06/2020 in [facility] [place]  C-SSRS RISK CATEGORY Moderate Risk Moderate Risk No Risk    Musculoskeletal  Strength & Muscle Tone: within normal limits Gait & Station: normal Patient leans: N/A  Psychiatric Specialty Exam  Presentation  General Appearance:  Appropriate for Environment  Eye Contact: Fair  Speech: Clear and Coherent  Speech Volume: Normal  Handedness: Right   Mood and Affect  Mood: Depressed  Affect: Congruent   Thought Process  Thought Processes: Coherent; Goal Directed  Descriptions of Associations:Intact  Orientation:Full (Time, Place and Person)  Thought Content:Logical  Diagnosis of Schizophrenia or Schizoaffective disorder in past: No    Hallucinations:Hallucinations: None  Ideas of Reference:None  Suicidal Thoughts:Suicidal Thoughts: No  Homicidal Thoughts:Homicidal Thoughts: No   Sensorium  Memory: Immediate Fair; Recent Fair; Remote Fair  Judgment: Fair  Insight: Fair   Art Therapist  Concentration: Fair  Attention Span: Fair  Recall: [employer] of Knowledge: Fair  Language: Fair   Psychomotor Activity  Psychomotor Activity: Psychomotor Activity: Normal   Assets  Assets: Communication Skills; Desire for Improvement   Sleep  Sleep: Sleep: Poor  Estimated Sleeping Duration (Last 24 Hours): 8.25-10.25 hours (Due to Daylight Saving Time, the durations displayed may not accurately represent documentation during the time change interval)  Nutritional Assessment (For OBS and FBC admissions only) Has the patient had a weight loss or gain of 10 pounds or  more in the last 3 months?: No Has the patient had a decrease in food intake/or  appetite?: No Does the patient have dental problems?: No Does the patient have eating habits or behaviors that may be indicators of an eating disorder including binging or inducing vomiting?: No Has the patient recently lost weight without trying?: 0 Has the patient been eating poorly because of a decreased appetite?: 0 Malnutrition Screening Tool Score: 0    Physical Exam  Physical Exam ROS Blood pressure 119/74, pulse 64, temperature 97.8 F (36.6 C), temperature source Oral, resp. rate 18, SpO2 98%. There is no height or weight on file to calculate BMI.  Demographic Factors:  Male and Caucasian  Loss Factors: Financial problems/change in socioeconomic status  Historical Factors: Impulsivity  Risk Reduction Factors:   Sense of responsibility to family, Living with another person, especially a relative, and Positive social support  Continued Clinical Symptoms:  Alcohol/Substance Abuse/Dependencies Previous Psychiatric Diagnoses and Treatments  Cognitive Features That Contribute To Risk:  None    Suicide Risk:  Minimal: No identifiable suicidal ideation.  Patients presenting with no risk factors but with morbid ruminations; may be classified as minimal risk based on the severity of the depressive symptoms  Plan Of Care/Follow-up recommendations:   Patient accepted to the [facility] on 01/12/24 and will transport via [employer] bus. Bus departs at 0800 am on 01/12/24.  [facility] [address],  [place], [place] [address] Phone: [phone]  Medication regimen:30 day prescriptions provided.  Continue Wellbutrin  XL 150 mg po daily Continue Multivitamin po daily  Continue Thiamine 100 mg po daily  Continue Vistaril  25 mg po every 6 hours as needed for anxiety  Continue Trazodone  50 mg po at bedtime as needed for sleep   Disposition: Discharge on 01/12/24 at 7 AM. Patient has been accepted to [facility] and will transfer via [employer].  Dimetri Armitage,  Fraida Veldman L, NP 01/11/2024, 2:27 PM

## 2024-01-11 NOTE — ED Notes (Signed)
 Pt reports feeling 'slow, draggy' and 'alright'. Medications reviewed, questions denied. Pt denies si hi and avh. Pt ate breakfast, denies physical pain and discomforts.

## 2024-01-11 NOTE — Care Management (Signed)
 [facility] Care Management   Patient is completing a phone intake interview with [facility] for a possible placement.

## 2024-01-11 NOTE — ED Notes (Signed)
 Pt came out of his room asked for coffee I let him know ill make it at 6am seemed upset went back to his room came out after 10 mins and walked back into the dayroom walked by [facility] farted and said I see theres still no coffee.

## 2024-01-11 NOTE — ED Notes (Signed)
 Gatorade administered.

## 2024-01-11 NOTE — ED Notes (Signed)
 Patient having intermittent sleep. Reassured.

## 2024-01-11 NOTE — Group Note (Signed)
 Group Topic: Understanding Self  Group Date: 01/11/2024 Start Time: 1200 End Time: 1230 Facilitators: Daved Tinnie HERO, RN  Department: [facility]  Number of Participants: 8  Group Focus: nursing group Treatment Modality:  Psychoeducation Interventions utilized were exploration Purpose: increase insight  Name: Manuel Kelly Date of Birth: 30-Jul-1970  MR: [id number]    Level of Participation: when cued Quality of Participation: attentive and cooperative Interactions with others: gave feedback Mood/Affect: appropriate Triggers (if applicable): n/a Cognition: insightful Progress: Gaining insight Response: pt says they will stay focused and determined to help maintain and strengthen their values Plan: patient will be encouraged to attend future RN groups   Patients Problems:  Patient Active Problem List   Diagnosis Date Noted   Stimulant use disorder 01/10/2024   Generalized anxiety disorder 11/27/2013

## 2024-01-11 NOTE — Discharge Instructions (Addendum)
 [name]...  Writer met with patient to discuss discharge planning.  Patient requested inpatient treatment.   Patient has been accepted to. [facility]  Patient can discharge Friday 01/12/2024 @ 7:00 am  Writer placed bus ticket in patients chart.   Bus leaves departs at 8:00 am.    [facility] [address]  [place], [place] [address] Phone: [phone]  RN will arrange transportation to bus terminal  [facility] will provide transportation  30 day scripts

## 2024-01-11 NOTE — Care Management (Addendum)
 [name]...  Addendum 12:49  Writer received bus ticket via email   Patient has been accepted to...  [facility] [address]  [place], [place] [address] Phone: [phone]  RN will arrange transportation to bus terminal  [facility] will provide bus ticket   30 day scripts

## 2024-01-12 NOTE — ED Notes (Signed)
 Pt discharged to [facility]. Pt was picked up by [employer], with belonging sent with him and the driver. Bus ticket to [facility], scripts and AVS reviewed and taken with him.

## 2024-01-12 NOTE — ED Notes (Signed)
 Pt is sleeping, no acute distress noted. Q15 safety checks in place.

## 2024-02-07 ENCOUNTER — Other Ambulatory Visit (HOSPITAL_COMMUNITY): Payer: Self-pay | Admitting: Psychiatry

## 2024-02-07 ENCOUNTER — Telehealth (HOSPITAL_COMMUNITY): Payer: Self-pay

## 2024-02-07 MED ORDER — TRAZODONE HCL 50 MG PO TABS: 50.0000 mg | ORAL_TABLET | Freq: Every evening | ORAL | 0 refills | Status: DC | PRN

## 2024-02-07 MED ORDER — HYDROXYZINE HCL 25 MG PO TABS: 25.0000 mg | ORAL_TABLET | Freq: Four times a day (QID) | ORAL | 0 refills | Status: DC | PRN

## 2024-02-07 MED ORDER — BUPROPION HCL ER (XL) 150 MG PO TB24: 150.0000 mg | ORAL_TABLET | Freq: Every day | ORAL | 0 refills | Status: DC

## 2024-02-07 NOTE — Telephone Encounter (Signed)
 Spoke with pt advised rx has been sent to pharmacy he verbalized understanding. Pt states that when he left the treatment center he went to [place] with his son that is working out of town. That's where he is now

## 2024-02-07 NOTE — Telephone Encounter (Signed)
 Pt called in requesting refills on his wellbutrin  150 mg, hydroxyzine  25 mg, and his trazodone  50mg  sent to a different pharmacy. Please send to [facility] in [place]. Pt scheduled 02/14/24. Please advise.

## 2024-02-07 NOTE — Telephone Encounter (Signed)
 Sent. Is he still at the [facility] treatment Center?

## 2024-02-07 NOTE — Telephone Encounter (Signed)
 Okay; thanks.

## 2024-02-14 ENCOUNTER — Encounter (HOSPITAL_COMMUNITY): Payer: Self-pay | Admitting: Psychiatry

## 2024-02-14 ENCOUNTER — Telehealth (HOSPITAL_COMMUNITY): Admitting: Psychiatry

## 2024-02-14 DIAGNOSIS — F332 Major depressive disorder, recurrent severe without psychotic features: Secondary | ICD-10-CM | POA: Diagnosis not present

## 2024-02-14 DIAGNOSIS — F1410 Cocaine abuse, uncomplicated: Secondary | ICD-10-CM | POA: Diagnosis not present

## 2024-02-14 DIAGNOSIS — F901 Attention-deficit hyperactivity disorder, predominantly hyperactive type: Secondary | ICD-10-CM

## 2024-02-14 DIAGNOSIS — F411 Generalized anxiety disorder: Secondary | ICD-10-CM | POA: Diagnosis not present

## 2024-02-14 MED ORDER — TRAZODONE HCL 100 MG PO TABS: 100.0000 mg | ORAL_TABLET | Freq: Every day | ORAL | 2 refills | Status: AC

## 2024-02-14 MED ORDER — HYDROXYZINE HCL 25 MG PO TABS: 25.0000 mg | ORAL_TABLET | Freq: Four times a day (QID) | ORAL | 0 refills | Status: DC | PRN

## 2024-02-14 MED ORDER — METHYLPHENIDATE HCL 20 MG PO TABS: 20.0000 mg | ORAL_TABLET | Freq: Two times a day (BID) | ORAL | 0 refills | Status: DC

## 2024-02-14 MED ORDER — BUPROPION HCL ER (XL) 300 MG PO TB24: 300.0000 mg | ORAL_TABLET | ORAL | 2 refills | Status: AC

## 2024-02-14 NOTE — Progress Notes (Signed)
 Virtual Visit via Telephone Note  I connected with Manuel Kelly on 02/14/2024 at  8:40 AM EST by telephone and verified that I am speaking with the correct person using two identifiers.  Location: Patient: home Provider: office   I discussed the limitations, risks, security and privacy concerns of performing an evaluation and management service by telephone and the availability of in person appointments. I also discussed with the patient that there may be a patient responsible charge related to this service. The patient expressed understanding and agreed to proceed.       I discussed the assessment and treatment plan with the patient. The patient was provided an opportunity to ask questions and all were answered. The patient agreed with the plan and demonstrated an understanding of the instructions.   The patient was advised to call back or seek an in-person evaluation if the symptoms worsen or if the condition fails to improve as anticipated.  I provided 20 minutes of non-face-to-face time during this encounter.   [name], MD  [facility] MD/PA/NP OP Progress Note  02/14/2024 9:02 AM Manuel Kelly  MRN:  [id number]  Chief Complaint:  Chief Complaint  Patient presents with   ADD   Anxiety   Depression   HPI: This patient is a 53 year old divorced white male who is living with his son in [place]. He is on disability.   The patient returns for follow-up after about 5 weeks regarding his major depression generalized anxiety disorder ADD and substance use problems.  He told me last time that he was increasingly depressed as he was coming up to the anniversary of his fiance's suicide.  He did go to the behavioral health urgent care center shortly after our last visit.  While there he explained that he had been using cocaine.  He told them that he had been using for several weeks at a time but he tells me today he had only done it 1 time.  His urine drug screen was positive for  cocaine.  After detox he was sent to the [facility] treatment center where he stayed about 3 weeks.  He is unsure if this was helpful and he has a lot of complaints about the program.  He is no longer using the drugs.  However he states he cannot focus without the methylphenidate  and I agreed to a lower dose.  He is still quite depressed so we will increase Wellbutrin  and also increase trazodone  to help with his sleep.  He is with his son's family and denies any thoughts of self-harm or suicide Visit Diagnosis:    ICD-10-CM   1. Severe episode of recurrent major depressive disorder, without psychotic features (HCC)  F33.2     2. Attention deficit hyperactivity disorder (ADHD), predominantly hyperactive type  F90.1     3. Generalized anxiety disorder  F41.1     4. Cocaine abuse (HCC)  F14.10       Past Psychiatric History: Past outpatient treatment  Past Medical History:  Past Medical History:  Diagnosis Date   Anxiety    Bulging lumbar disc    Degenerative arthritis    GERD (gastroesophageal reflux disease)    Hypoglycemia     Past Surgical History:  Procedure Laterality Date   CLAVICLE SURGERY     HAND SURGERY      Family Psychiatric History: See below  Family History:  Family History  Problem Relation Age of Onset   Anxiety disorder Mother    Alcohol abuse  Father    Anxiety disorder Father    Depression Father    ADD / ADHD Son     Social History:  Social History   Socioeconomic History   Marital status: Divorced    Spouse name: Not on file   Number of children: Not on file   Years of education: Not on file   Highest education level: Not on file  Occupational History   Not on file  Tobacco Use   Smoking status: Every Day    Current packs/day: 1.00    Average packs/day: 1 pack/day for 30.0 years (30.0 ttl pk-yrs)    Types: Cigarettes   Smokeless tobacco: Never  Substance and Sexual Activity   Alcohol use: No   Drug use: No    Comment: per pt he stopped  Cocaine 5-6 years ago and did use Marijuana Christmas of 2014   Sexual activity: Yes    Partners: Female    Birth control/protection: Condom  Other Topics Concern   Not on file  Social History Narrative   Not on file   Social Drivers of Health   Tobacco Use: High Risk (01/08/2024)   Patient History    Smoking Tobacco Use: Every Day    Smokeless Tobacco Use: Never    Passive Exposure: Not on file  Financial Resource Strain: Not on file  Food Insecurity: No Food Insecurity (01/10/2024)   Epic    Worried About Programme Researcher, Broadcasting/film/video in the Last Year: Never true    Ran Out of Food in the Last Year: Never true  Recent Concern: Food Insecurity - Food Insecurity Present (01/09/2024)   Epic    Worried About Programme Researcher, Broadcasting/film/video in the Last Year: Sometimes true    Ran Out of Food in the Last Year: Sometimes true  Transportation Needs: Unmet Transportation Needs (01/10/2024)   Epic    Lack of Transportation (Medical): Yes    Lack of Transportation (Non-Medical): Yes  Physical Activity: Not on file  Stress: Not on file  Social Connections: Not on file  Depression (PHQ2-9): High Risk (01/10/2024)   Depression (PHQ2-9)    PHQ-2 Score: 12  Alcohol Screen: Low Risk (01/10/2024)   Alcohol Screen    Last Alcohol Screening Score (AUDIT): 1  Housing: Not on file  Utilities: Not At Risk (01/10/2024)   Epic    Threatened with loss of utilities: No  Health Literacy: Not on file    Allergies: Allergies[1]  Metabolic Disorder Labs: No results found for: HGBA1C, MPG No results found for: PROLACTIN No results found for: CHOL, TRIG, HDL, CHOLHDL, VLDL, LDLCALC Lab Results  Component Value Date   TSH 3.508 01/09/2024    Therapeutic Level Labs: No results found for: LITHIUM No results found for: VALPROATE No results found for: CBMZ  Current Medications: Current Outpatient Medications  Medication Sig Dispense Refill   buPROPion  (WELLBUTRIN  XL) 300 MG 24 hr tablet  Take 1 tablet (300 mg total) by mouth every morning. 30 tablet 2   methylphenidate  (RITALIN ) 20 MG tablet Take 1 tablet (20 mg total) by mouth 2 (two) times daily with breakfast and lunch. 60 tablet 0   traZODone  (DESYREL ) 100 MG tablet Take 1 tablet (100 mg total) by mouth at bedtime. 30 tablet 2   hydrOXYzine  (ATARAX ) 25 MG tablet Take 1 tablet (25 mg total) by mouth every 6 (six) hours as needed for anxiety. 30 tablet 0   Multiple Vitamin (MULTIVITAMIN WITH MINERALS) TABS tablet Take 1 tablet by mouth  daily. 30 tablet 0   nicotine  (NICODERM CQ  - DOSED IN MG/24 HOURS) 21 mg/24hr patch Place 1 patch (21 mg total) onto the skin daily. 30 patch 0   thiamine  (VITAMIN B-1) 100 MG tablet Take 1 tablet (100 mg total) by mouth daily. 30 tablet 0   No current facility-administered medications for this visit.     Musculoskeletal: Strength & Muscle Tone: na Gait & Station: na Patient leans: N/A  Psychiatric Specialty Exam: Review of Systems  Psychiatric/Behavioral:  Positive for dysphoric mood and sleep disturbance. The patient is nervous/anxious.   All other systems reviewed and are negative.   There were no vitals taken for this visit.There is no height or weight on file to calculate BMI.  General Appearance: na  Eye Contact:  na  Speech:  Clear and Coherent  Volume:  Normal  Mood:  Anxious and Dysphoric  Affect:  NA  Thought Process:  Goal Directed  Orientation:  Full (Time, Place, and Person)  Thought Content: Rumination   Suicidal Thoughts:  No  Homicidal Thoughts:  No  Memory:  Immediate;   Good Recent;   Good Remote;   NA  Judgement:  Fair  Insight:  Shallow  Psychomotor Activity:  Restlessness  Concentration:  Concentration: Poor and Attention Span: Poor  Recall:  Good  Fund of Knowledge: Good  Language: Good  Akathisia:  No  Handed:  Right  AIMS (if indicated): not done  Assets:  Communication Skills Desire for Improvement Resilience Social Support  ADL's:  Intact   Cognition: WNL  Sleep:  Poor   Screenings: AUDIT    Flowsheet Row ED from 01/10/2024 in [facility]  Alcohol Use Disorder Identification Test Final Score (AUDIT) 1   PHQ2-9    Flowsheet Row ED from 01/10/2024 in [facility] Video Visit from 11/06/2020 in [facility] Total Score 2 0  PHQ-9 Total Score 12 --   Flowsheet Row ED from 01/10/2024 in [facility] ED from [facility]/09/20[facility] Video Visit from 11/06/2020 in [facility] at [place]  C-SSRS RISK CATEGORY Moderate Risk Moderate Risk No Risk     Assessment and Plan: This patient is a 53 year old male with a history of major depression generalized anxiety disorder attention deficit disorder and recent cocaine abuse.  He states he is not focusing well without the methylphenidate  so we will reinstate it at a lower dose-20 mg twice daily.  He will increase Wellbutrin  XL to 300 mg daily for major depression, continue hydroxyzine  25 mg every 6 hours as needed for anxiety and increase trazodone  to 100 mg at bedtime for sleep.  He will return to see me in 4 weeks  Collaboration of Care: Collaboration of Care: Referral or follow-up with counselor/therapist AEB patient will be referred to therapist Lauraine Ferrari in our office  Patient/Guardian was advised Release of Information must be obtained prior to any record release in order to collaborate their care with an outside provider. Patient/Guardian was advised if they have not already done so to contact the registration department to sign all necessary forms in order for us  to release information regarding their care.   Consent: Patient/Guardian gives verbal consent for treatment and assignment of benefits for services provided during this visit. Patient/Guardian expressed understanding and agreed to  proceed.    [name], MD 02/14/2024, 9:02 AM     [1]  Allergies Allergen Reactions   Iodinated Contrast Media Anaphylaxis   Talwin [Pentazocine] Swelling and Other (See Comments)    Tongue swell up   Other Swelling, Rash and Other (See Comments)    IV Dye - Throat swells up

## 2024-02-28 ENCOUNTER — Ambulatory Visit (HOSPITAL_COMMUNITY)

## 2024-02-28 DIAGNOSIS — Z91199 Patient's noncompliance with other medical treatment and regimen due to unspecified reason: Secondary | ICD-10-CM

## 2024-02-28 NOTE — Progress Notes (Signed)
 Manuel Kelly did not arrive for his schedule video visit today.  Therapist texted link X2 to the phone number requested [phone].

## 2024-03-13 ENCOUNTER — Telehealth (HOSPITAL_COMMUNITY): Admitting: Psychiatry

## 2024-03-13 ENCOUNTER — Telehealth (HOSPITAL_COMMUNITY): Payer: Self-pay

## 2024-03-13 ENCOUNTER — Other Ambulatory Visit (HOSPITAL_COMMUNITY): Payer: Self-pay | Admitting: Psychiatry

## 2024-03-13 MED ORDER — HYDROXYZINE HCL 25 MG PO TABS: 25.0000 mg | ORAL_TABLET | Freq: Four times a day (QID) | ORAL | 2 refills | Status: AC | PRN

## 2024-03-13 MED ORDER — METHYLPHENIDATE HCL 20 MG PO TABS: 20.0000 mg | ORAL_TABLET | Freq: Two times a day (BID) | ORAL | 0 refills | Status: AC

## 2024-03-13 NOTE — Telephone Encounter (Signed)
 Pt's appt was changed from 03/13/24 to 03/26/24. He is needing a refill of his hydroxyzine  25 mg tablet and his methylphenidate  20 MG tablet sent to [facility]. Please advise.

## 2024-03-13 NOTE — Telephone Encounter (Signed)
 sent

## 2024-03-13 NOTE — Telephone Encounter (Signed)
 Called pt no answer left vm

## 2024-03-26 ENCOUNTER — Telehealth (HOSPITAL_COMMUNITY): Admitting: Psychiatry

## 2024-04-03 ENCOUNTER — Telehealth (HOSPITAL_COMMUNITY): Admitting: Psychiatry
